# Patient Record
Sex: Female | Born: 1960
Health system: Southern US, Community
[De-identification: ages and names within clinical notes are randomized; demographics above are authoritative.]

## PROBLEM LIST (undated history)

## (undated) DIAGNOSIS — R112 Nausea with vomiting, unspecified: Secondary | ICD-10-CM

## (undated) DIAGNOSIS — I447 Left bundle-branch block, unspecified: Secondary | ICD-10-CM

## (undated) DIAGNOSIS — I428 Other cardiomyopathies: Secondary | ICD-10-CM

## (undated) DIAGNOSIS — G4700 Insomnia, unspecified: Secondary | ICD-10-CM

## (undated) DIAGNOSIS — I10 Essential (primary) hypertension: Secondary | ICD-10-CM

## (undated) DIAGNOSIS — Z9889 Other specified postprocedural states: Secondary | ICD-10-CM

## (undated) DIAGNOSIS — D649 Anemia, unspecified: Secondary | ICD-10-CM

## (undated) DIAGNOSIS — E7800 Pure hypercholesterolemia, unspecified: Secondary | ICD-10-CM

## (undated) DIAGNOSIS — I5022 Chronic systolic (congestive) heart failure: Secondary | ICD-10-CM

## (undated) DIAGNOSIS — E669 Obesity, unspecified: Secondary | ICD-10-CM

## (undated) HISTORY — PX: ABDOMINAL HYSTERECTOMY: SHX81

## (undated) HISTORY — DX: Left bundle-branch block, unspecified: I44.7

## (undated) HISTORY — DX: Pure hypercholesterolemia, unspecified: E78.00

## (undated) HISTORY — PX: WISDOM TOOTH EXTRACTION: SHX21

## (undated) HISTORY — PX: COLONOSCOPY: SHX174

## (undated) HISTORY — DX: Other cardiomyopathies: I42.8

## (undated) HISTORY — DX: Chronic systolic (congestive) heart failure: I50.22

## (undated) HISTORY — DX: Obesity, unspecified: E66.9

## (undated) HISTORY — PX: OTHER SURGICAL HISTORY: SHX169

---

## 2002-08-26 ENCOUNTER — Encounter (INDEPENDENT_AMBULATORY_CARE_PROVIDER_SITE_OTHER): Payer: Self-pay | Admitting: *Deleted

## 2002-08-26 ENCOUNTER — Observation Stay (HOSPITAL_COMMUNITY): Admission: RE | Admit: 2002-08-26 | Discharge: 2002-08-27 | Payer: Self-pay | Admitting: Obstetrics and Gynecology

## 2004-08-01 ENCOUNTER — Other Ambulatory Visit: Admission: RE | Admit: 2004-08-01 | Discharge: 2004-08-01 | Payer: Self-pay | Admitting: Obstetrics and Gynecology

## 2004-10-05 ENCOUNTER — Encounter: Admission: RE | Admit: 2004-10-05 | Discharge: 2004-10-05 | Payer: Self-pay | Admitting: General Surgery

## 2004-10-07 ENCOUNTER — Encounter (INDEPENDENT_AMBULATORY_CARE_PROVIDER_SITE_OTHER): Payer: Self-pay | Admitting: Specialist

## 2004-10-07 ENCOUNTER — Ambulatory Visit (HOSPITAL_BASED_OUTPATIENT_CLINIC_OR_DEPARTMENT_OTHER): Admission: RE | Admit: 2004-10-07 | Discharge: 2004-10-07 | Payer: Self-pay | Admitting: General Surgery

## 2004-10-07 ENCOUNTER — Ambulatory Visit (HOSPITAL_COMMUNITY): Admission: RE | Admit: 2004-10-07 | Discharge: 2004-10-07 | Payer: Self-pay | Admitting: General Surgery

## 2004-12-06 ENCOUNTER — Other Ambulatory Visit: Admission: RE | Admit: 2004-12-06 | Discharge: 2004-12-06 | Payer: Self-pay | Admitting: Obstetrics and Gynecology

## 2005-04-26 ENCOUNTER — Other Ambulatory Visit: Admission: RE | Admit: 2005-04-26 | Discharge: 2005-04-26 | Payer: Self-pay | Admitting: Obstetrics and Gynecology

## 2005-09-05 ENCOUNTER — Other Ambulatory Visit: Admission: RE | Admit: 2005-09-05 | Discharge: 2005-09-05 | Payer: Self-pay | Admitting: Obstetrics and Gynecology

## 2006-12-08 ENCOUNTER — Encounter (INDEPENDENT_AMBULATORY_CARE_PROVIDER_SITE_OTHER): Payer: Self-pay | Admitting: Obstetrics and Gynecology

## 2006-12-08 ENCOUNTER — Ambulatory Visit (HOSPITAL_COMMUNITY): Admission: RE | Admit: 2006-12-08 | Discharge: 2006-12-08 | Payer: Self-pay | Admitting: Obstetrics and Gynecology

## 2008-02-28 HISTORY — PX: CARDIAC CATHETERIZATION: SHX172

## 2008-06-30 ENCOUNTER — Encounter: Admission: RE | Admit: 2008-06-30 | Discharge: 2008-06-30 | Payer: Self-pay | Admitting: Cardiology

## 2008-07-07 ENCOUNTER — Ambulatory Visit (HOSPITAL_COMMUNITY): Admission: RE | Admit: 2008-07-07 | Discharge: 2008-07-07 | Payer: Self-pay | Admitting: Cardiology

## 2009-11-25 ENCOUNTER — Encounter: Admission: RE | Admit: 2009-11-25 | Discharge: 2009-11-25 | Payer: Self-pay | Admitting: Family Medicine

## 2009-11-25 IMAGING — US US EXTREM LOW VENOUS*L*
1 series · 14 of 24 positions shown · non-contrast
Comparison: None.

CLINICAL DATA: Left lower extremity pain and swelling.

LEFT LOWER EXTREMITY VENOUS DOPPLER ULTRASOUND [DATE]:
TECHNIQUE: Gray-scale sonography with compression, as well as
color and duplex Doppler ultrasound, were performed to evaluate the
deep venous system from the level of the common femoral vein
through the popliteal and proximal calf veins. Evaluation also
included physiologic response to augmentation.

[Series 1: us extrem low venous*left* · 14 of 29 slices shown]
[im 1/29]
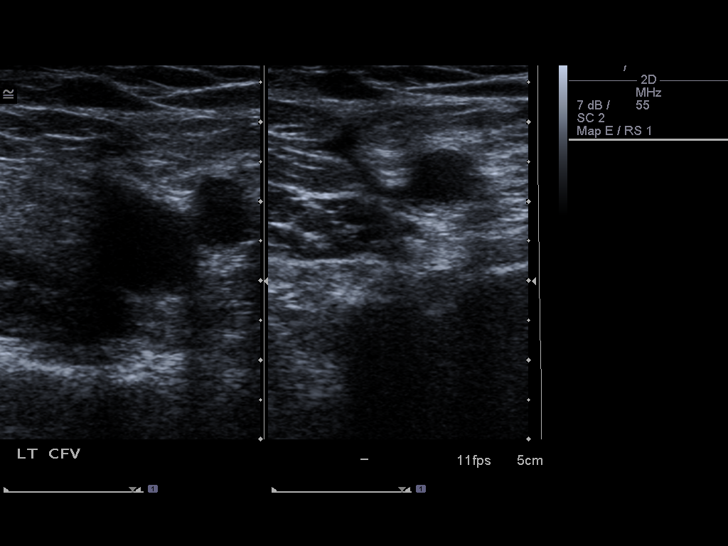
[im 3/29]
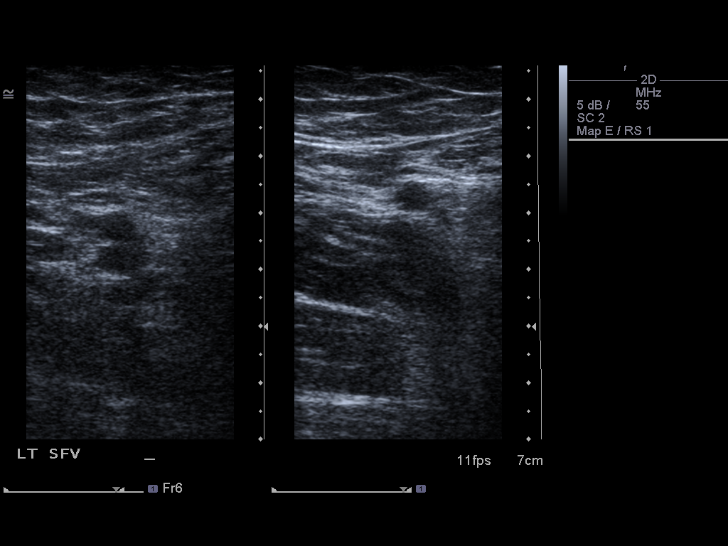
[im 5/29]
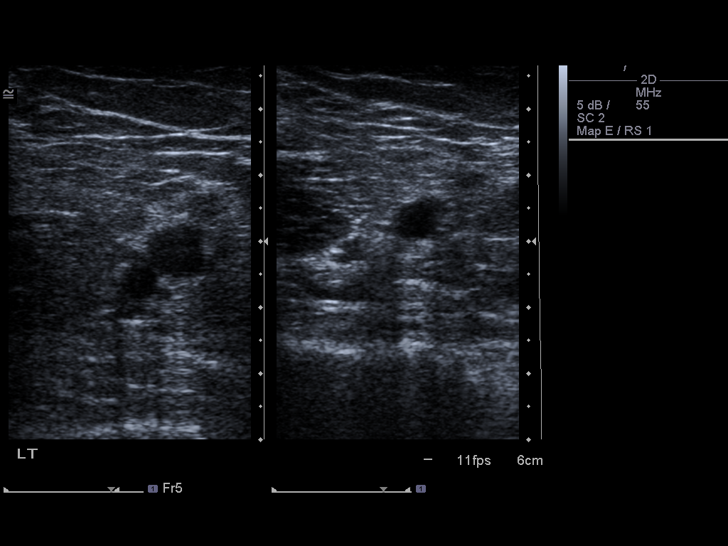
[im 8/29]
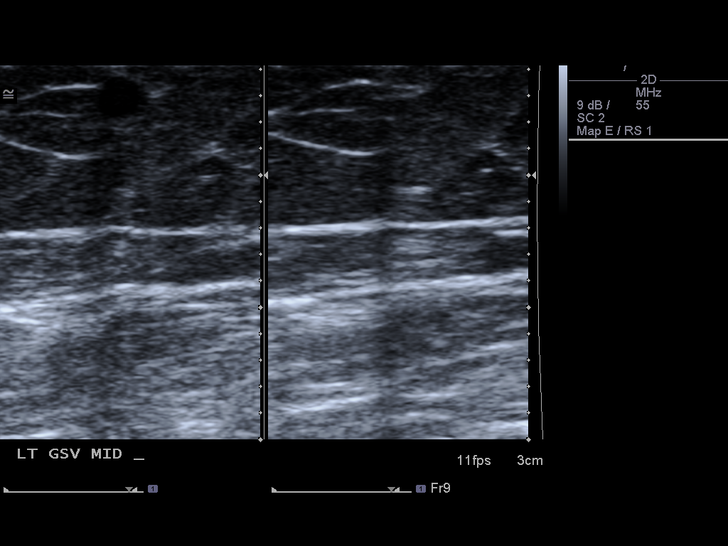
[im 9/29]
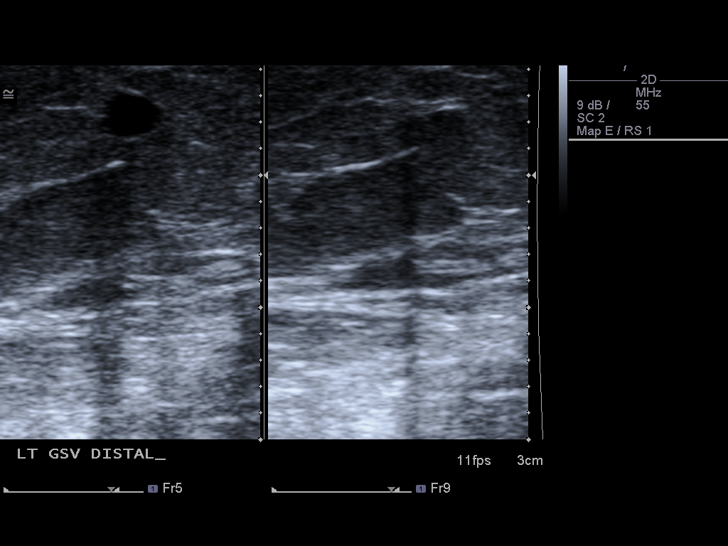
[im 11/29]
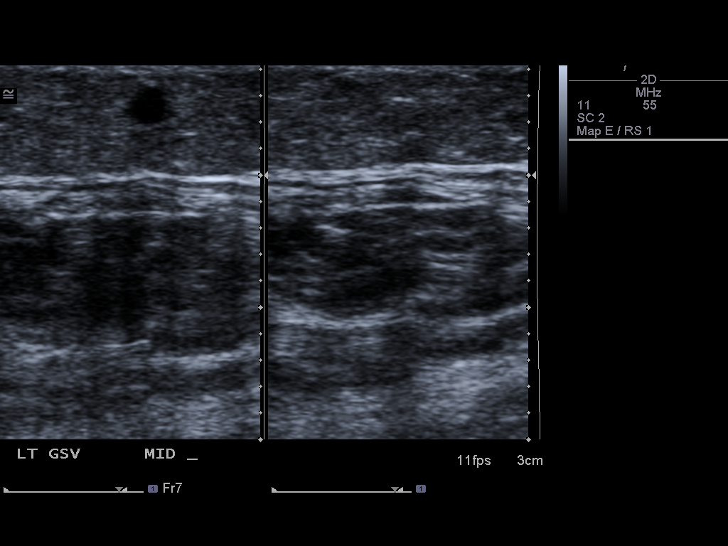
[im 14/29]
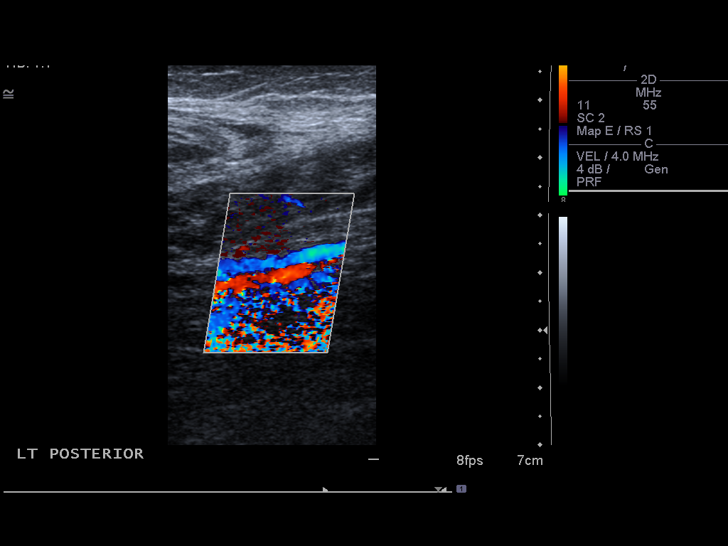
[im 15/29]
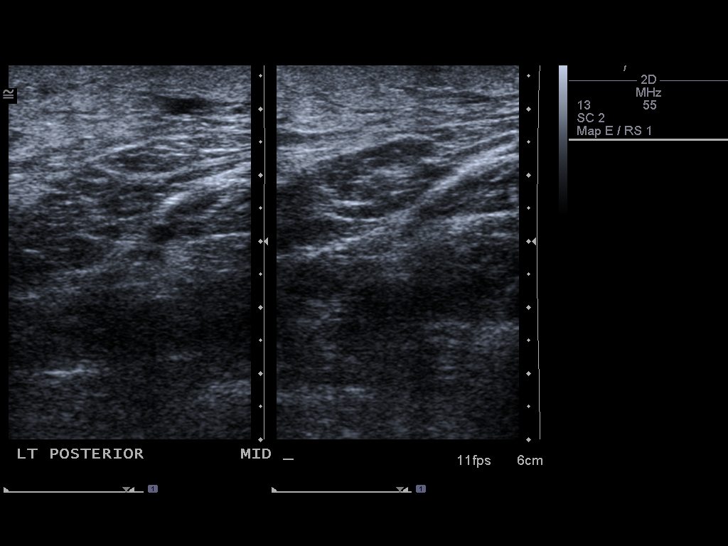
[im 18/29]
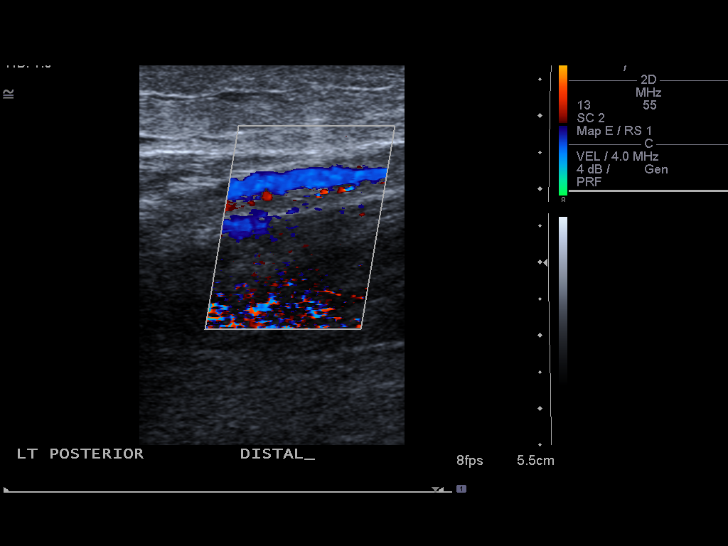
[im 20/29]
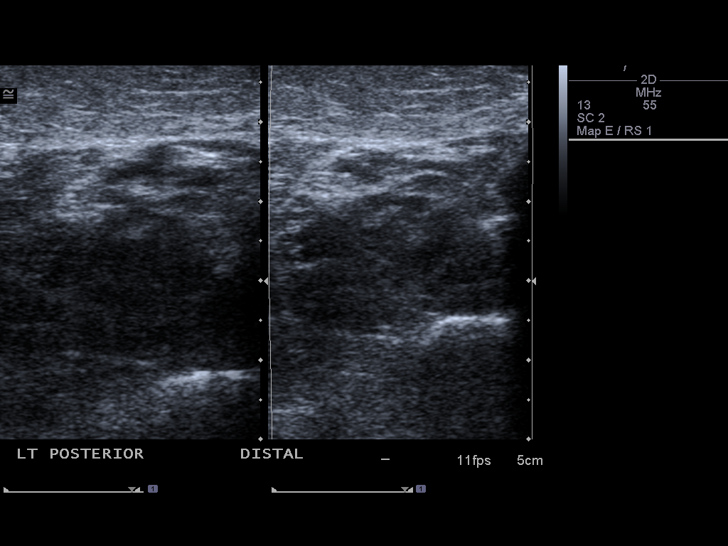
[im 22/29]
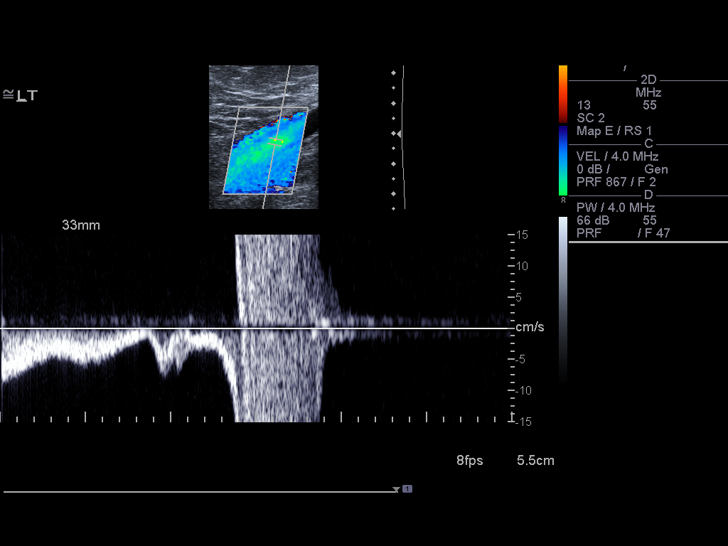
[im 24/29]
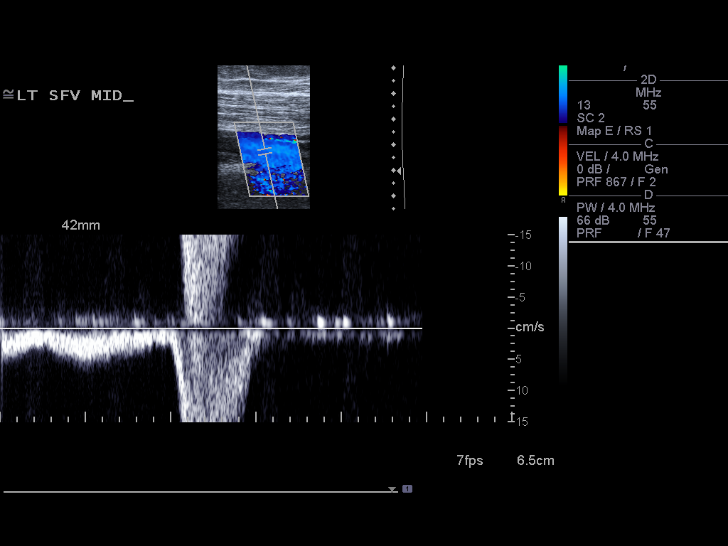
[im 26/29]
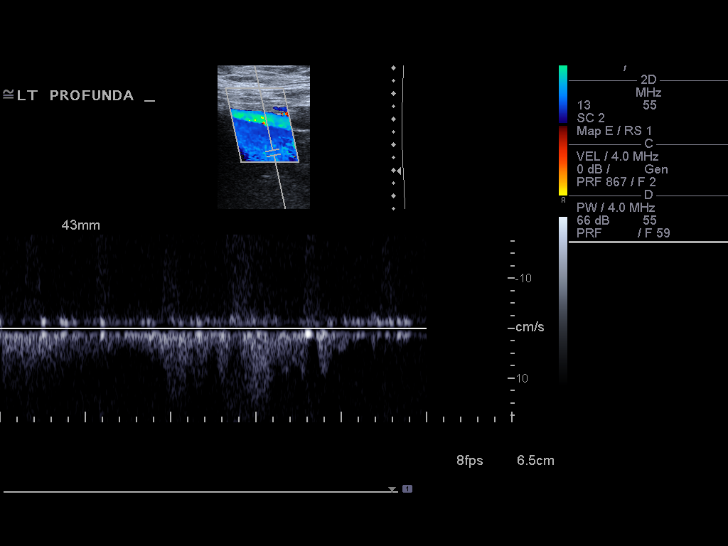
[im 29/29]
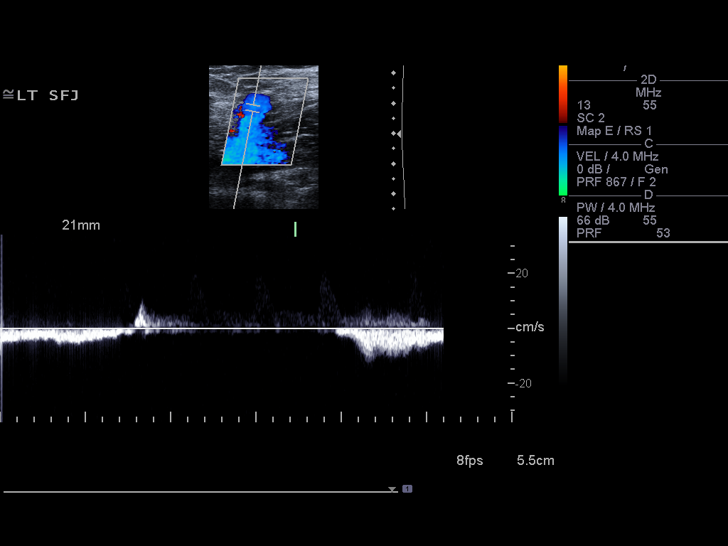

[14 of 24 positions shown; findings below may reference images not displayed]

FINDINGS: All of the visualized deep veins in the left lower
extremity demonstrate normal Doppler signal, normal
compressibility, normal phasicity, and normal physiologic response
to augmentation.  No gray scale filling defects were identified.
The visualized greater saphenous veins similarly patent in its
visualized course in the left lower extremity.
IMPRESSION: No evidence of left lower extremity DVT or superficial
thrombophlebitis.

## 2010-06-28 HISTORY — PX: FEMORAL ARTERY STENT: SHX1583

## 2010-07-12 NOTE — H&P (Signed)
Terri [name], Terri [name]               ACCOUNT NO.:  [id number]   MEDICAL RECORD NO.:  [id number]          PATIENT TYPE:  AMB   LOCATION:  SDC                           [facility]ACILITY:  [facility]   PHYSICIAN:  Hal Morales, M.D.DATE OF BIRTH:  June 10, 1960   DATE OF ADMISSION:  12/08/2006  DATE OF DISCHARGE:                              HISTORY & PHYSICAL   HISTORY OF PRESENT ILLNESS:  The patient is a 50 year old black female  who presents for management of a new left vulvar cystic lesion.  The  patient noted on November 22, 2006, a lump in the vaginal area which  was not painful except with sitting.  There was no induration or  discharge.  The patient was seen in our office on November 29, 2006, at  which time the cystic lesion could not be aspirated and a decision was  made for excision of the lesion.  The patient had had previous episodes  of insertional dyspareunia, but had never been noted to have cystic  lesions.   PAST MEDICAL HISTORY:  The patient has hypertension which is well  controlled with oral medications.   PAST SURGICAL HISTORY:  The patient had a laparoscopically-assisted  vaginal hysterectomy in 2004 for uterine fibroids, menorrhagia, and  anemia.  She underwent a transvaginal urethral suspension prior to 2004  for urinary stress incontinence.   FAMILY HISTORY:  Positive for heart disease, thyroid disease,  hypertension, diabetes, and migraine headaches.   CURRENT MEDICATIONS:  1. Estrogen vaginal cream.  2. Benicar.  3. Hydrochlorothiazide.   ALLERGIES:  No known drug allergies.   REVIEW OF SYSTEMS:  Specifically negative for vaginal discharge or  vaginal pain, but positive for insertional dyspareunia.   PHYSICAL EXAMINATION:  VITAL SIGNS:  Blood pressure 130/80, pulse 80.  LUNGS:  Clear.  HEART:  Regular rate and rhythm.  ABDOMEN:  Soft without masses or organomegaly.  PELVIC:  EGBUS shows a 2 cm mass in the left posterior vulva which is  submucosal.  Solid versus  tensely cystic, mobile, and nontender.  The  vaginal apex is well-healed.  There are no adnexal masses, no  rectovaginal masses.   IMPRESSION:  Recent onset of a vulvar cyst.   DISPOSITION:  The patient will undergo excision of the vulvar cyst as an  outpatient procedure.  The risks of anesthesia, bleeding, infection, and  damage to adjacent organs have been explained and the patient wishes to  proceed.  This will be done at [facility] on December 08, 2006.      Hal Morales, M.D.  Electronically Signed     VPH/MEDQ  D:  12/07/2006  T:  12/07/2006  Job:  [id number]

## 2010-07-12 NOTE — Op Note (Signed)
[name], Terri [name]               ACCOUNT NO.:  [id number]   MEDICAL RECORD NO.:  [id number]          PATIENT TYPE:  AMB   LOCATION:  SDC                           [facility]   PHYSICIAN:  Hal Morales, M.D.DATE OF BIRTH:  01/04/1961   DATE OF PROCEDURE:  12/08/2006  DATE OF DISCHARGE:                               OPERATIVE REPORT   PREOPERATIVE DIAGNOSIS:  Left vulvar cyst.   POSTOPERATIVE DIAGNOSIS:  Left vulvar cyst and left vulvar pigmented  lesion.   PROCEDURE:  Excision of left vulvar cyst, excisional biopsy of left  vulvar pigmented lesion.   SURGEON:  Hal Morales, M.D.   ANESTHESIA:  Monitored anesthesia care and local.   ESTIMATED BLOOD LOSS:  Less than 20 mL.   COMPLICATIONS:  None.   FINDINGS:  The left vulvar area contained a 3 cm mobile cystic mass just  beneath the vaginal mucosa at the introitus.  The left vulvar skin  contained a 1 cm hyperpigmented lesion.   OPERATIVE PROCEDURE:  The patient was taken to the operating room, after  appropriate identification, and placed on the operating table.  After  placement of equipment for monitored anesthesia care, she was placed in  the lithotomy position.  The perineum and vagina were prepped with  multiple layers of Betadine and draped as a sterile field.  The left  vulvar region was infiltrated first with 10 mL of 2% lidocaine and then  with 10 mL of 0.25% Marcaine.  The skin overlying the cystic vulvar  lesion was incised down to the level of the cyst and a combination of  blunt and sharp dissection used to dissect out the cystic lesion intact.  The aforementioned vulvar skin lesion was then circumscribed with a  scalpel and excised.  That incision was closed with subcuticular sutures  of 5-0 Vicryl.  The bed of the cystic lesion was then closed with  interrupted sutures of 3-0 Vicryl and the squamomucosal junction closed  with subcuticular sutures of 4-0 Vicryl.  Hemostasis was noted to be  adequate and the patient was taken from the operating room to the  recovery room in satisfactory condition, having tolerated the procedure  well with sponge and instrument counts correct.   DISCHARGE MEDICATIONS:  Ibuprofen 600 mg p.o. q.6h. for 24 hours and  then as needed for pain.   DISCHARGE INSTRUCTIONS:  Discharge instructions printed from the [facility].  Discharge instructions include ice packs to the perineum for  24 hours.  Follow up in two weeks with Dr. Pennie Rushing.      Hal Morales, M.D.  Electronically Signed     VPH/MEDQ  D:  12/08/2006  T:  12/08/2006  Job:  [id number]

## 2010-07-15 NOTE — Op Note (Signed)
Terri [name], APPLEMAN                         ACCOUNT NO.:  [id number]   MEDICAL RECORD NO.:  [id number]                   PATIENT TYPE:  OBV   LOCATION:  9325                                 [facility]ILITY[facility]   PHYSICIAN:  Hal Morales, M.D.             DATE OF BIRTH:  May 02, 1960   DATE OF PROCEDURE:  08/26/2002  DATE OF DISCHARGE:                                 OPERATIVE REPORT   PREOPERATIVE DIAGNOSES:  1. Menorrhagia.  2. Anemia.  3. Uterine fibroids.  4. Simple endometrial hyperplasia.   POSTOPERATIVE DIAGNOSES:  1. Menorrhagia.  2. Anemia.  3. Uterine fibroids.  4. Simple endometrial hyperplasia.   OPERATION:  Diagnostic laparoscopy, total vaginal hysterectomy with uterine  morcellization.   ANESTHESIA:  General orotracheal anesthesia.   ESTIMATED BLOOD LOSS:  250 mL.   COMPLICATIONS:  None.   SURGEON:  Hal Morales, M.D.   FIRST ASSISTANT:  Elmira J. Adline Peals.   FINDINGS:  The uterus was enlarged to approximately 12-week size with  multiple myomata.  The weight was 329.7 g.  The ovaries appeared normal. The  tubes were status post bilateral ligation for sterilization.   DESCRIPTION OF PROCEDURE:  The patient was taken to the operating room after  appropriate identification and placed on the operating table.  After the  attainment of adequate general anesthesia, she was placed in the modified  lithotomy position.  The abdomen, perineum, and vagina were prepped with  multiple layers of Betadine. A Foley catheter was inserted into the bladder  and connected to straight drainage.  A Hulka tenaculum was placed on the  cervix.  The abdomen was draped as a sterile field.  Subumbilical and  suprapubic injection of a total of 10 mL of 0.25% Marcaine was undertaken.  A subumbilical incision was made and a Veress cannula placed through that  incision into the peritoneal cavity.  A pneumoperitoneum was created with  3.5 liters of CO2.  The laparoscopic  trocar was placed through that incision  after removal of the Veress cannula and the laparoscope placed through the  trocar sleeve.  A suprapubic incision was made to the left of midline and a  laparoscopic probe trocar placed through that incision into the peritoneal  cavity under direct visualization.  The above noted findings were made and  the decision made that vaginal hysterectomy was feasible.  The vagina then  became the site of surgery.  A weighted speculum was placed in the vagina  and the Hulka tenaculum replaced with two Lahey tenaculums on the cervix.  The cervicovaginal mucosa was injected with a dilute solution of Pitressin  and then circumscribed.  The anterior vaginal mucosa was dissected off the  anterior cervix to allow entry into the anterior peritoneum.  The posterior  peritoneum was sharply entered and the right and left uterosacral ligaments  clamped, cut, and suture ligated with those sutures being held.  Paracervical tissues, parametrial tissues were then clamped, cut, and suture  ligated.  The uterus was then morcellated to allow into the involuted and  the upper pedicles clamped, cut and tied with free tie and suture ligated  with those sutures being held.  The uterus was removed from the operative  field in portions during the morcellization.  Hemostasis was noted to be  adequate.  A McCall culdoplasty suture was placed in the posterior cul-de-  sac incorporating the uterosacral ligaments and the intervening peritoneum.  The sutures were checked then held for the uterosacral ligaments, were then  used to create the vaginal angle on either side.  The remainder of the  vaginal cuff was then closed in a single layer incorporating anterior  vaginal mucosa, anterior peritoneum, posterior peritoneum and posterior  vaginal mucosa in figure-of-eight sutures.  All sutures used in this  procedure were 0 Vicryl.  Hemostasis was noted to be adequate and the vagina  was  packed with two inch plain gauze moistened with Estrace cream.  The  trocars were removed from the abdomen and a single suture of 0 Vicryl placed  in the fascia of the subumbilical incision.  Subcuticular suture of 3-0  Vicryl was then used to close that incision.  A subcuticular suture of 3-0  Vicryl was likewise used to close the suprapubic incision.  These two  incisions were covered with sterile dressings and the patient awakened from  general anesthesia, then taken to the recovery room in satisfactory  condition having tolerated the procedure well.  The sponge and instrument  counts correct.   SPECIMENS:  The uterus and cervix were sent to the pathology with the uterus  having been morcellated.                                               Hal Morales, M.D.    VPH/MEDQ  D:  08/26/2002  T:  08/26/2002  Job:  [id number]

## 2010-07-15 NOTE — H&P (Signed)
   [name], [name]                         ACCOUNT NO.:  [id number]   MEDICAL RECORD NO.:  [id number]                   PATIENT TYPE:  AMB   LOCATION:  SDC                                  [facility]   PHYSICIAN:  Hal Morales, M.D.             DATE OF BIRTH:  Jun 23, 1960   DATE OF ADMISSION:  DATE OF DISCHARGE:                                HISTORY & PHYSICAL   ADDENDUM TO DICTATION #[id number]   CURRENT MEDICATIONS:  1. Provera 40 mg daily.  2. Ferrous sulfate one tablet twice daily.  3. Avalide 150/12.5 one-half tablet daily.   ALLERGIES:  The patient has no known drug allergies.     Terri [name].                    Hal Morales, M.D.    EJP/MEDQ  D:  08/15/2002  T:  08/15/2002  Job:  [id number]

## 2010-07-15 NOTE — Op Note (Signed)
NAMELAVONE, Terri [name]               ACCOUNT NO.:  [id number]   MEDICAL RECORD NO.:  [id number]          PATIENT TYPE:  AMB   LOCATION:  NESC                         FACILITY:  [facility]   PHYSICIAN:  Leonie Man, M.D.   DATE OF BIRTH:  06/24/1960   DATE OF PROCEDURE:  10/07/2004  DATE OF DISCHARGE:                                 OPERATIVE REPORT   PREOPERATIVE DIAGNOSIS:  Lipoma off the left posterior axillary region.   POSTOPERATIVE DIAGNOSIS:  Lipoma off the left posterior axillary region.   PROCEDURE:  Excision lipoma left posterior axillary region.   SURGEON:  Leonie Man, M.D.   ASSISTANT:  OR tech.   ANESTHESIA:  General.   Note, the patient is a 50 year old woman with an enlarging mass in the  posterior axillary line extending toward the trapezius muscles in the back.  This has been causing her significant discomfort and ill-fitting clothing.  She comes to the operating room now for removal of this mass after the risks  and potential benefits of surgery have been discussed, all questions  answered and consent obtained.   DESCRIPTION OF PROCEDURE:  Following the induction of satisfactory general  anesthesia, the patient positioned supinely and then turned into the right  lateral recumbent position with her left side up. This region was prepped  and draped to be included in a sterile operative field. I infiltrated around  the lesion with 0.5% Marcaine with epinephrine. A transverse incision made  in the skin lines deep and through the skin and subcutaneous tissue to the  capsule of the mass. The mass was then dissected free in all areas and  removed and forwarded for pathologic evaluation. Hemostasis assured with  electrocautery. Sponge and instrument counts verified and subcutaneous  tissues closed with interrupted 3-0 Vicryl sutures. Skin closed with running  4-0 Monocryl suture and then reinforced with Steri-Strips. A sterile  dressing is applied. The anesthetic  reversed. The patient removed from the  operating room to the recovery room in stable condition. She tolerated the  procedure well.       PB/MEDQ  D:  10/07/2004  T:  10/07/2004  Job:  [id number]

## 2010-07-15 NOTE — H&P (Signed)
NAME:  Terri [name], Terri [name]                         ACCOUNT NO.:  [id number]   MEDICAL RECORD NO.:  [id number]                   [name] TYPE:  AMB   LOCATION:  SDC                                  [facility]   PHYSICIAN:  Hal Morales, M.D.             DATE OF BIRTH:  [birth year]   DATE OF ADMISSION:  08/15/2002  DATE OF DISCHARGE:                                HISTORY & PHYSICAL   HISTORY OF PRESENT ILLNESS:  Terri [name] is a 50 year old widowed African-  American female (para 2-0-1-1), who presents for laparoscopy and total  vaginal hysterectomy because of menorrhagia and fibroids unresponsive to  hormonal therapy.  Up until one year ago Terri [name] had regular monthly  menstrual periods; however, over Terri past year Terri [name] menses have become  irregular and very heavy.  Terri [name] reports that Terri [name] has skipped periods  within Terri past year, followed by heavy menstrual bleeding which lasts as  much as 14 days.  More recently, since May 03, 2002, Terri [name] has bled  continuously in spite of Provera therapy, (30 mg to 40 mg daily) until August 10, 2002, when it finally stopped.  During this period Terri [name] wore an  overnight pad with disposable baby diapers, changing approximately five  times daily on most days, and other days use overnight pads changing twice  daily.  This bleeding was accompanied by menstrual cramps, which Terri [name] rates  as a 9/10 on a 10-point scale; however, Terri [name] received relief to 3/10 on a 10-  point scale with ibuprofen 800 mg.  Terri [name] also mentioned that this pain would  radiate to both hips and Terri [name] lower back area.  In Terri course of Terri [name]  symptoms, Terri [name] had a hemoglobin as low as 7.3 in February 2004;  however, Terri [name] reticulocyte count was normal, Terri [name] had a normal hemoglobin  electrophoresis, negative gonorrhea and chlamydia cultures, and normal CSH.  A pelvic ultrasound in February 2004 revealed multiple uterine fibroids,  with Terri largest measuring 4.0 x  3.9 x 4.3 cm (some fibroids displaced Terri  endometrial canal) and a simple right ovarian cyst measuring 2.6 x 2.7 x 2.8  cm.  An endometrial biopsy also in February 2004 revealed simple hyperplasia  without atypia.   As a consideration of medical and surgical options presented to Terri [name], Terri  [name] has agreed to undergo hysterectomy for definitive therapy of Terri [name]  symptoms.   PAST MEDICAL HISTORY:   OBSTETRICAL HISTORY:  Gravida 3, para 2-0-1-1; Terri [name] has had a  cesarean section in Terri past.   GYNECOLOGICAL HISTORY:  Menarche at 50 years old.  Terri [name]'s menstrual  periods were regular up until one year ago.  See history of present illness.  Terri [name] uses bilateral tubal ligation as Terri [name] method of contraception.  Terri [name] denies any history of sexually transmitted diseases or abnormal Pap  smears.  Terri [name]'s last normal  Pap smear was June 2003.  Terri [name] last normal  mammogram was April 2004.   MEDICAL HISTORY:  Positive for anemia and hypertension.   SURGICAL HISTORY:  Terri [name] had bladder suspension in Terri 1990's.  Terri [name]  denies any problems with anesthesia.  Terri [name] has never had a blood transfusion;  however, Terri [name] did receive platelets with Terri [name] last delivery approximately 11  years ago.   FAMILY HISTORY:  Positive for heart disease, hypertension, insulin-dependent  diabetes mellitus and migraines.   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   HABITS:  Terri [name] does not use tobacco or alcohol.   REVIEW OF SYSTEMS:  Terri [name] occasionally has sinusitis.  Terri [name] does wear  reading glasses.  Terri [name] admits to frequent flatulence.  Otherwise, except as  mentioned in Terri history of present illness, Terri [name] review of systems are  negative.   PHYSICAL EXAMINATION:  VITAL SIGNS:  Blood pressure 110/70, weight 174  pounds, height 5 feet 1-1/2 inches.  NECK:  Supple without  masses.  There is no adenopathy or thyromegaly.  HEART:  Regular rate and rhythm.  There is no murmur.  LUNGS:  Clear to auscultation.  There are no wheezes, rales or rhonchi.  BACK:  No CVA tenderness.  ABDOMEN:  Bowel sounds are present.  Soft.  Terri [name] does have tenderness  in both lower quadrants; however, there is no guarding, rebound or  organomegaly.  EXTREMITIES:  Without clubbing, cyanosis or edema.  PELVIC:  EGBUS is within normal limits.  Vagina is rugose.  Cervix has  Nabothian cyst, otherwise nontender.  Uterus appears approximately 10-week  size, is retroverted without tenderness.  Adnexa without tenderness or  masses.  Rectovaginal examination confirms.   IMPRESSION:  1. Menorrhagia.  2. Anemia (hemoglobin as low as 7.3).  3. Fibroid uterus.  4. Simple endometrial hyperplasia.   DISPOSITION:  Management options for fibroids and menorrhagia were reviewed  with Terri [name] by Dr. Pennie Rushing, along with Terri risks and benefits of each.  Terri [name] has chosen definitive treatment for Terri [name] symptoms in Terri form of  hysterectomy, and understands Terri implications for this procedure along with  Terri risks, which include, but are limited to:  reaction to anesthesia,  damage to adjacent organs, bleeding, infection.  Terri [name] further understands  that Terri [name] will lose Terri [name] reproductive ability with this procedure.   Terri [name] has consented to undergo a laparoscopically-assisted vaginal  hysterectomy, with Terri possibility of a total abdominal hysterectomy on August 26, 2002 at 7:30 a.m. at [facility] [employer].     Elmira J. Adline Peals.                    Hal Morales, M.D.    EJP/MEDQ  D:  08/15/2002  T:  08/15/2002  Job:  [id number]

## 2010-07-15 NOTE — Discharge Summary (Signed)
NAMEHAJIRA, VERHAGEN                         ACCOUNT NO.:  [id number]   MEDICAL RECORD NO.:  [id number]                   PATIENT TYPE:  OBV   LOCATION:  9325                                 [facility]ILITY[facility]   PHYSICIAN:  Hal Morales, M.D.             DATE OF BIRTH:  10-16-1960   DATE OF ADMISSION:  08/26/2002  DATE OF DISCHARGE:  08/27/2002                                 DISCHARGE SUMMARY   DISCHARGE DIAGNOSES:  1. Menorrhagia.  2. Anemia (hemoglobin as low as 7.3).  3. Fibroid uterus.  4. Simple endometrial hyperplasia.   OPERATION:  On the date of admission patient underwent laparoscopy with a  total vaginal hysterectomy and uterine morcellation.  Patient was found to  have a multiple fibroid uterus weighing 329.7 grams, along with normal  appearing ovaries and tubes which had been previously ligated bilaterally.   HISTORY OF PRESENT ILLNESS:  Ms. Terri [name] is a 50 year old widowed, African  American female para 2-0-1-1 who presents for laparoscopy and total vaginal  hysterectomy because of menorrhagia and uterine fibroids unresponsive to  hormonal therapy.  Please see patient's dictated history and physical  examination for details.   PHYSICAL EXAMINATION:  VITAL SIGNS:  Blood pressure 110/70, weight 174  pounds, height 5 feet 1 and 1/2 inches tall.  GENERAL:  Within normal limits.  ABDOMEN:  Note on pre-operative abdominal exam patient had tenderness in  both lower quadrants but there was no guarding, rebound or organomegaly.  PELVIC:  EG, BUS within normal limits.  Vagina is rugous.  Cervix had  nabothian cyst otherwise not tender without other lesions.  Uterus appeared  approximately ten weeks size, was retroverted and without tenderness.  Her  adnexa without tenderness or masses.  Rectovaginal exam confirmed.   HOSPITAL COURSE:  On the date of admission the patient underwent  aforementioned procedures, tolerating them all well.  Postoperative course  was marked by  nausea which resolved with Reglan 10 mg every six hours.  Patient was afebrile throughout her hospital course and post-op hemoglobin  was that of 8.7 (pre-op hemoglobin 10.4).  By post-op day one, by the  afternoon of post-op day #1 the patient had resumed bowel and bladder  function and was, therefore, deemed ready for discharge home.   DISCHARGE MEDICATIONS:  1. Tylox 1-2 tablets every four to six hours as needed for pain.  2. Iron one tablet twice daily for six weeks.  3. Ibuprofen 600 mg one tablet every six hours with food for five days then     as needed for pain.  4. Colace 100 mg twice daily until bowel movements are regular.  5. Avalide 150/12.5 mg one half tablet daily for blood pressure.  6. Reglan 10 mg every six hours as needed for nausea.   FOLLOW UP:  Patient is scheduled for her six weeks postoperative visit with  Dr. Pennie Rushing on October 07, 2002 at 1:30  p.m.   DISCHARGE INSTRUCTIONS:  Patient was given a copy of [facility]  postoperative instruction sheet.  She was further advised to avoid driving  for two weeks, heavy lifting for four weeks and intercourse for six weeks.  The patient's diet was without restriction.  Her final pathology was not  available at the time of the patient's discharge.      Elmira J. Adline Peals.                    Hal Morales, M.D.    EJP/MEDQ  D:  08/27/2002  T:  08/27/2002  Job:  [id number]

## 2011-04-21 ENCOUNTER — Other Ambulatory Visit (HOSPITAL_COMMUNITY): Payer: Self-pay | Admitting: Cardiology

## 2011-04-21 DIAGNOSIS — I4891 Unspecified atrial fibrillation: Secondary | ICD-10-CM

## 2011-05-05 ENCOUNTER — Ambulatory Visit (HOSPITAL_COMMUNITY)
Admission: RE | Admit: 2011-05-05 | Discharge: 2011-05-05 | Disposition: A | Discharge status: Home / Self Care | Payer: 59 | Source: Ambulatory Visit | Attending: Cardiology | Admitting: Cardiology

## 2011-05-05 DIAGNOSIS — R0989 Other specified symptoms and signs involving the circulatory and respiratory systems: Secondary | ICD-10-CM | POA: Insufficient documentation

## 2011-05-05 DIAGNOSIS — R0609 Other forms of dyspnea: Secondary | ICD-10-CM | POA: Insufficient documentation

## 2012-01-19 ENCOUNTER — Emergency Department (HOSPITAL_COMMUNITY)
Admission: EM | Admit: 2012-01-19 | Discharge: 2012-01-19 | Disposition: A | Discharge status: Home / Self Care | Payer: 59 | Attending: Emergency Medicine | Admitting: Emergency Medicine

## 2012-01-19 ENCOUNTER — Emergency Department (HOSPITAL_COMMUNITY): Payer: 59

## 2012-01-19 ENCOUNTER — Encounter (HOSPITAL_COMMUNITY): Payer: Self-pay | Admitting: Emergency Medicine

## 2012-01-19 DIAGNOSIS — M79609 Pain in unspecified limb: Secondary | ICD-10-CM | POA: Insufficient documentation

## 2012-01-19 DIAGNOSIS — M79603 Pain in arm, unspecified: Secondary | ICD-10-CM

## 2012-01-19 DIAGNOSIS — R0781 Pleurodynia: Secondary | ICD-10-CM

## 2012-01-19 DIAGNOSIS — Z79899 Other long term (current) drug therapy: Secondary | ICD-10-CM | POA: Insufficient documentation

## 2012-01-19 DIAGNOSIS — R0789 Other chest pain: Secondary | ICD-10-CM | POA: Insufficient documentation

## 2012-01-19 DIAGNOSIS — I10 Essential (primary) hypertension: Secondary | ICD-10-CM | POA: Insufficient documentation

## 2012-01-19 DIAGNOSIS — R071 Chest pain on breathing: Secondary | ICD-10-CM | POA: Insufficient documentation

## 2012-01-19 HISTORY — DX: Essential (primary) hypertension: I10

## 2012-01-19 LAB — CBC
MCH: 26.6 pg (ref 26.0–34.0)
MCHC: 33.3 g/dL (ref 30.0–36.0)
RBC: 4.7 MIL/uL (ref 3.87–5.11)
WBC: 5 10*3/uL (ref 4.0–10.5)

## 2012-01-19 LAB — BASIC METABOLIC PANEL
CO2: 27 mEq/L (ref 19–32)
GFR calc Af Amer: >90 mL/min (ref >90)
GFR calc non Af Amer: >90 mL/min (ref >90)
Sodium: 138 mEq/L (ref 135–145)

## 2012-01-19 LAB — POCT I-STAT TROPONIN I

## 2012-01-19 MED ORDER — OXYCODONE-ACETAMINOPHEN 5-325 MG PO TABS
2.0000 | ORAL_TABLET | Freq: Once | ORAL | Status: AC
  Administered 2012-01-19: 2 via ORAL
  Filled 2012-01-19: qty 2

## 2012-01-19 NOTE — ED Provider Notes (Signed)
Medical screening examination/treatment/procedure(s) were conducted as a shared visit with non-physician practitioner(s) and myself.  I personally evaluated the patient during the encounter  I examined patient, sent by PCP for eval and possible admission.  Story not c/w ACS on my reval.  Her EKG appears unchanged (old LBBB).  Per PCP records she had negative cath in 2010 and nonischemic CM.  ddimer negative for PE.  CXR negative.  She has left arm pain worse with movement but no focal weakness and good/equal pulses.  She reports heavy lifting at work that may have worsened arm pain.  Doubt dissection or pericarditis/myocarditis   BP 151/81  Pulse 66  Temp 98.5 F (36.9 C) (Oral)  Resp 16  SpO2 100%   Joya Gaskins, MD 01/19/12 1652

## 2012-01-19 NOTE — ED Notes (Signed)
EDP in room at this time 

## 2012-01-19 NOTE — ED Notes (Addendum)
To ED via [facility] medic 32- from [place] physicians- had chest pain that started yesterday while working 8/10 pain. Worsened throughout night, pain on palpation and left arm movement, no numbness tingling, "feels funny" no nausea today-- had some yesterday.   Received NTG 1 sl, ASA 324mg  per EMS -- pain at present 6/10.

## 2012-01-19 NOTE — ED Provider Notes (Signed)
History     CSN: [id number]  Arrival date & time 01/19/12  1328   First MD Initiated Contact with Patient 01/19/12 1357      Chief Complaint  Patient presents with  . Chest Pain    (Consider location/radiation/quality/duration/timing/severity/associated sxs/prior treatment) HPI Comments: 51 year old female presents the emergency department via EMS from [facility] physician office complaining of sudden onset left sided chest pain beginning yesterday while at work. Patient works as a Sales executive. She describes the pain as a constant dull feeling with occasional sharp shooting pains rated 8/10. Admits to associated left arm pain throughout her entire arm. She received one nitroglycerin and aspirin from EMS without much relief. Currently her pain is 6/10 and not as bad as yesterday. Pain worse when she takes in a deep breath, coughs or laughs. Denies nausea, vomiting or diaphoresis. No shortness of breath. Patient sees Dr. Mayford Knife every 6 months for her hypertension and left bundle branch block. Nonsmoker and does not take any exogenous estrogen. Patient recently drove to [place] at the end of October and prior to that drove to [place].  Patient is a 51 y.o. female presenting with chest pain. The history is provided by the patient.  Chest Pain Pertinent negatives for primary symptoms include no shortness of breath, no nausea, no vomiting and no dizziness.  Pertinent negatives for associated symptoms include no diaphoresis, no numbness and no weakness.     Past Medical History  Diagnosis Date  . Hypertension     Past Surgical History  Procedure Date  . Cardiac catheterization     No family history on file.  History  Substance Use Topics  . Smoking status: Not on file  . Smokeless tobacco: Not on file  . Alcohol Use:     OB History    Grav Para Term Preterm Abortions TAB SAB Ect Mult Living                  Review of Systems  Constitutional: Negative for diaphoresis.    HENT: Negative for neck pain and neck stiffness.   Eyes: Negative for visual disturbance.  Respiratory: Negative for shortness of breath.   Cardiovascular: Positive for chest pain.  Gastrointestinal: Negative for nausea and vomiting.  Genitourinary: Negative.   Musculoskeletal: Positive for arthralgias. Negative for back pain.  Skin: Negative for rash.  Neurological: Negative for dizziness, weakness, light-headedness, numbness and headaches.  Psychiatric/Behavioral: Negative for confusion. The patient is not nervous/anxious.     Allergies  Review of patient's allergies indicates no known allergies.  Home Medications   Current Outpatient Rx  Name  Route  Sig  Dispense  Refill  . CARVEDILOL 25 MG PO TABS   Oral   Take 25 mg by mouth 2 (two) times daily with a meal.         . FUROSEMIDE 20 MG PO TABS   Oral   Take 20 mg by mouth 2 (two) times daily.         Marland Kitchen RAMIPRIL 10 MG PO CAPS   Oral   Take 10 mg by mouth daily.           BP 153/78  Pulse 67  Temp 98.5 F (36.9 C) (Oral)  Resp 16  SpO2 100%  Physical Exam  Nursing note and vitals reviewed. Constitutional: She is oriented to person, place, and time. She appears well-developed and well-nourished. No distress.  HENT:  Head: Normocephalic and atraumatic.  Mouth/Throat: Oropharynx is clear and moist.  Eyes: Conjunctivae normal and EOM are normal. Pupils are equal, round, and reactive to light.  Neck: Normal range of motion. Neck supple.  Cardiovascular: Normal rate, regular rhythm, normal heart sounds and intact distal pulses.   Pulmonary/Chest: Effort normal and breath sounds normal. No accessory muscle usage. No respiratory distress. She has no decreased breath sounds. She has no rhonchi. She has no rales. She exhibits no tenderness.       Deep inspiration re-creates chest pain.  Abdominal: Soft. Bowel sounds are normal. There is no tenderness.  Musculoskeletal: Normal range of motion. She exhibits no edema.        Left shoulder: She exhibits tenderness. She exhibits normal range of motion (pain with ROM) and no bony tenderness.  Neurological: She is alert and oriented to person, place, and time.  Skin: Skin is warm and dry. No rash noted.  Psychiatric: She has a normal mood and affect. Her behavior is normal.    ED Course  Procedures (including critical care time)   Labs Reviewed  CBC  BASIC METABOLIC PANEL  POCT I-STAT TROPONIN I  D-DIMER, QUANTITATIVE   Date: 01/19/2012  Rate: 63  Rhythm: normal sinus rhythm  QRS Axis: left  Intervals: normal  ST/T Wave abnormalities: normal  Conduction Disutrbances:left bundle branch block  Narrative Interpretation: sinus rhythm, LBBB, no stemi  Old EKG Reviewed: unchanged   Dg Chest Port 1 View  01/19/2012  *RADIOLOGY REPORT*  Clinical Data: Left chest/back pain  PORTABLE CHEST - 1 VIEW  Comparison: 06/30/2008  Findings: Lungs are clear. No pleural effusion or pneumothorax.  The heart is top normal in size.  IMPRESSION: No evidence of acute cardiopulmonary disease.   Original Report Authenticated By: Charline Bills, M.D.      1. Pleuritic chest pain   2. Arm pain       MDM  51 y/o female with pleuritic chest pain. Age >50, so she cannot PERC negative. Vitals unremarkable. CXR unremarkable. EKG without any changes. Troponin negative. D-dimer obtained due to the fact she has had two long car rides recently. She is in NAD. 3:12 PM D-dimer negative. Patient was sent from PCP for admission to rule out cardiac origin. Arm pain is musculoskeletal. Chest pain pleuritic. Suspicion of cardiac origin very low. Case discussed with Dr. Bebe Shaggy who also evaluated patient. We both do not feel admission is necessary. Patient agrees with this. Advised her to stay home from work tomorrow to rest. Advised ibuprofen for arm pain. Close return precautions discussed.        Trevor Mace, PA-C 01/19/12 1551

## 2012-12-17 ENCOUNTER — Telehealth: Payer: Self-pay | Admitting: Cardiology

## 2012-12-17 ENCOUNTER — Other Ambulatory Visit: Payer: Self-pay | Admitting: General Surgery

## 2012-12-17 MED ORDER — RAMIPRIL 10 MG PO CAPS: 10.0000 mg | ORAL_CAPSULE | Freq: Every day | ORAL | Status: DC

## 2012-12-17 MED ORDER — AMLODIPINE BESYLATE 5 MG PO TABS: 5.0000 mg | ORAL_TABLET | Freq: Every day | ORAL | Status: DC

## 2012-12-17 MED ORDER — FUROSEMIDE 20 MG PO TABS: 20.0000 mg | ORAL_TABLET | Freq: Two times a day (BID) | ORAL | Status: DC

## 2012-12-17 MED ORDER — CARVEDILOL 25 MG PO TABS: 25.0000 mg | ORAL_TABLET | Freq: Two times a day (BID) | ORAL | Status: DC

## 2012-12-17 NOTE — Telephone Encounter (Signed)
New message    Refill on all medications----[facility] in [facility]

## 2012-12-17 NOTE — Telephone Encounter (Signed)
Rx sent in for pt.

## 2012-12-19 ENCOUNTER — Encounter: Payer: Self-pay | Admitting: Cardiology

## 2012-12-26 DIAGNOSIS — R002 Palpitations: Secondary | ICD-10-CM

## 2013-05-21 ENCOUNTER — Encounter: Payer: Self-pay | Admitting: General Surgery

## 2013-05-21 DIAGNOSIS — I10 Essential (primary) hypertension: Secondary | ICD-10-CM

## 2013-05-21 DIAGNOSIS — I447 Left bundle-branch block, unspecified: Secondary | ICD-10-CM

## 2013-05-21 DIAGNOSIS — I428 Other cardiomyopathies: Secondary | ICD-10-CM

## 2013-05-21 DIAGNOSIS — I5022 Chronic systolic (congestive) heart failure: Secondary | ICD-10-CM

## 2013-05-21 DIAGNOSIS — R002 Palpitations: Secondary | ICD-10-CM

## 2013-05-23 ENCOUNTER — Ambulatory Visit: Payer: 59 | Admitting: Cardiology

## 2013-05-26 ENCOUNTER — Encounter: Payer: Self-pay | Admitting: Cardiology

## 2013-05-26 ENCOUNTER — Encounter: Payer: Self-pay | Admitting: General Surgery

## 2013-05-26 ENCOUNTER — Ambulatory Visit (INDEPENDENT_AMBULATORY_CARE_PROVIDER_SITE_OTHER): Payer: 59 | Admitting: Cardiology

## 2013-05-26 VITALS — BP 120/80 | HR 74 | Ht 62.0 in | Wt 171.0 lb

## 2013-05-26 DIAGNOSIS — I509 Heart failure, unspecified: Secondary | ICD-10-CM

## 2013-05-26 DIAGNOSIS — I420 Dilated cardiomyopathy: Secondary | ICD-10-CM

## 2013-05-26 DIAGNOSIS — I10 Essential (primary) hypertension: Secondary | ICD-10-CM

## 2013-05-26 DIAGNOSIS — I447 Left bundle-branch block, unspecified: Secondary | ICD-10-CM

## 2013-05-26 DIAGNOSIS — I5022 Chronic systolic (congestive) heart failure: Secondary | ICD-10-CM

## 2013-05-26 DIAGNOSIS — R079 Chest pain, unspecified: Secondary | ICD-10-CM | POA: Insufficient documentation

## 2013-05-26 DIAGNOSIS — I428 Other cardiomyopathies: Secondary | ICD-10-CM

## 2013-05-26 LAB — BASIC METABOLIC PANEL
BUN: 12 mg/dL (ref 6–23)
CHLORIDE: 104 meq/L (ref 96–112)
CO2: 25 meq/L (ref 19–32)
CREATININE: 0.9 mg/dL (ref 0.4–1.2)
Calcium: 9 mg/dL (ref 8.4–10.5)
GFR: 89.96 mL/min (ref >60.00)
Glucose, Bld: 97 mg/dL (ref 70–99)
Potassium: 3.8 mEq/L (ref 3.5–5.1)
Sodium: 136 mEq/L (ref 135–145)

## 2013-05-26 NOTE — Progress Notes (Signed)
  [place], [place], [place]  [address] Phone: [phone] Fax:  [phone]  Date:  05/26/2013   ID:  Terri [name], DOB 1961/01/28, MRN [id number]  PCP:  Vena Austria, MD  Cardiologist:  Fransico Him, MD    History of Present Illness: Terri [name] is a 53 y.o. female with a history of nonischemic DCM EF 40-45% presumed idiopathic, Class 2 Chronic systolic CHF and HTN who presents today for followup.  She is doing well.  She denies any SOB, DOE, LE edema, dizziness, palpitations or syncope.  She has had some problems with waking up with chest pain that she cannot describe that radiates into her back.  She sits up and stretches and it eventually goes away.  She denies any diaphoresis, nausea or SOB and it sometimes resolves with passing gas.     Wt Readings from Last 3 Encounters:  05/26/13 171 lb (77.565 kg)     Past Medical History  Diagnosis Date  . Hypertension   . Hypercholesteremia   . Nonischemic cardiomyopathy     LVEF 40-45%, presumed idiopathic by echo 05/2012  . Chronic systolic congestive heart failure, NYHA class 2     Current Outpatient Prescriptions  Medication Sig Dispense Refill  . amLODipine (NORVASC) 5 MG tablet Take 1 tablet (5 mg total) by mouth daily.  30 tablet  11  . carvedilol (COREG) 25 MG tablet Take 1 tablet (25 mg total) by mouth 2 (two) times daily with a meal.  60 tablet  11  . furosemide (LASIX) 20 MG tablet Take 20 mg by mouth daily.      . ramipril (ALTACE) 10 MG capsule Take 1 capsule (10 mg total) by mouth daily.  30 capsule  11   No current facility-administered medications for this visit.    Allergies:   No Known Allergies  Social History:  The patient  reports that she has never smoked. She does not have any smokeless tobacco history on file. She reports that she does not drink alcohol or use illicit drugs.   Family History:  The patient's family history includes CVA in her mother; Hypertension in her  brother, brother, father, mother, and sister.   ROS:  Please see the history of present illness.      All other systems reviewed and negative.   PHYSICAL EXAM: VS:  BP 120/80  Pulse 74  Ht 5\' 2"  (1.575 m)  Wt 171 lb (77.565 kg)  BMI 31.27 kg/m2 Well nourished, well developed, in no acute distress HEENT: normal Neck: no JVD Cardiac:  normal S1, S2; RRR; no murmur Lungs:  clear to auscultation bilaterally, no wheezing, rhonchi or rales Abd: soft, nontender, no hepatomegaly Ext: no edema Skin: warm and dry Neuro:  CNs 2-12 intact, no focal abnormalities noted  EKG:   NSR with LBBB    ASSESSMENT AND PLAN:  1. Chronic systolic CHF Class 2  - continue carvedilol/ramipril/Lasix - check BMET 2. Idiopathic DCM EF 40-45% 3.   HTN well controlled - continue Ramipril/Coreg/amlodipine 4.   Chronic LBBB 5.   Atypical chest pain that sounds more like GERD symptoms especially since it only occurs at night - Lexiscan myoview  Followup with me in 6 months  Signed, Fransico Him, MD 05/26/2013 9:03 AM

## 2013-05-26 NOTE — Patient Instructions (Signed)
Your physician recommends that you continue on your current medications as directed. Please refer to the Current Medication list given to you today.  Your physician recommends that you go to the lab today for a BMET Panel  Your physician has requested that you have a lexiscan myoview. For further information please visit [email or web address]. Please follow instruction sheet, as given.  Your physician wants you to follow-up in: 6 months with Dr Mallie Snooks will receive a reminder letter in the mail two months in advance. If you don't receive a letter, please call our office to schedule the follow-up appointment.

## 2013-06-09 ENCOUNTER — Ambulatory Visit (HOSPITAL_COMMUNITY): Payer: 59 | Attending: Cardiovascular Disease | Admitting: Radiology

## 2013-06-09 VITALS — BP 136/76 | HR 63 | Ht 62.0 in | Wt 169.0 lb

## 2013-06-09 DIAGNOSIS — I509 Heart failure, unspecified: Secondary | ICD-10-CM | POA: Insufficient documentation

## 2013-06-09 DIAGNOSIS — Z8249 Family history of ischemic heart disease and other diseases of the circulatory system: Secondary | ICD-10-CM | POA: Insufficient documentation

## 2013-06-09 DIAGNOSIS — I447 Left bundle-branch block, unspecified: Secondary | ICD-10-CM | POA: Insufficient documentation

## 2013-06-09 DIAGNOSIS — R079 Chest pain, unspecified: Secondary | ICD-10-CM

## 2013-06-09 DIAGNOSIS — R0789 Other chest pain: Secondary | ICD-10-CM | POA: Insufficient documentation

## 2013-06-09 MED ORDER — TECHNETIUM TC 99M SESTAMIBI GENERIC - CARDIOLITE
30.0000 | Freq: Once | INTRAVENOUS | Status: AC | PRN
  Administered 2013-06-09: 30 via INTRAVENOUS

## 2013-06-09 MED ORDER — ADENOSINE (DIAGNOSTIC) 3 MG/ML IV SOLN
0.5600 mg/kg | Freq: Once | INTRAVENOUS | Status: AC
  Administered 2013-06-09: 42.9 mg via INTRAVENOUS

## 2013-06-09 MED ORDER — TECHNETIUM TC 99M SESTAMIBI GENERIC - CARDIOLITE
10.0000 | Freq: Once | INTRAVENOUS | Status: AC | PRN
  Administered 2013-06-09: 10 via INTRAVENOUS

## 2013-06-09 NOTE — Progress Notes (Signed)
[place] [facility] NUCLEAR [facility] [address] [place], [place] [address] [phone]    Cardiology Nuclear Med Study  Terri [name] is a 53 y.o. female     MRN : [id number]     DOB: 1960-07-26  Procedure Date: 06/09/2013  Nuclear Med Background Indication for Stress Test:  Evaluation for Ischemia History:  No known CAD, NICM/CHF, Echo 2014 EF 40-45%, MPI 2014 (normal) EF 46% Cardiac Risk Factors: Family History - CAD, Hypertension, LBBB, Lipids and PVD  Symptoms:  Chest Pain (last date of chest discomfort was two weeks ago)   Nuclear Pre-Procedure Caffeine/Decaff Intake:  None > 12 hrs NPO After: 11:30pm   Lungs:  clear O2 Sat: 98% on room air. IV 0.9% NS with Angio Cath:  22g  IV Site: R Hand x 1, tolerated well IV Started by:  Irven Baltimore, RN  Chest Size (in):  36 Cup Size: G  Height: 5\' 2"  (1.575 m)  Weight:  169 lb (76.658 kg)  BMI:  Body mass index is 30.9 kg/(m^2). Tech Comments:  No Coreg or morning medications today.    Nuclear Med Study 1 or 2 day study: 1 day  Stress Test Type:  Adenosine  Reading MD: N/A  Order Authorizing Provider:  Fransico Him, MD  Resting Radionuclide: Technetium 15m Sestamibi  Resting Radionuclide Dose: 11.0 mCi   Stress Radionuclide:  Technetium 46m Sestamibi  Stress Radionuclide Dose: 33.0 mCi           Stress Protocol Rest HR: 63 Stress HR: 91  Rest BP: 136/76 Stress BP: 119/66  Exercise Time (min): n/a METS: n/a           Dose of Adenosine (mg):  43 mg Dose of Lexiscan: n/a mg  Dose of Atropine (mg): n/a Dose of Dobutamine: n/a mcg/kg/min (at max HR)  Stress Test Technologist: Glade Lloyd, BS-ES  Nuclear Technologist:  Annye Rusk, CNMT     Rest Procedure:  Myocardial perfusion imaging was performed at rest 45 minutes following the intravenous administration of Technetium 95m Sestamibi. Rest ECG: NSR-LBBB  Stress Procedure:  The patient received IV adenosine at 140 mcg/kg/min for 4 minutes.  Technetium 69m  Sestamibi was injected at the 2 minute mark and quantitative spect images were obtained after a 45 minute delay.   During the infusion of Adenosine, the patient complained of a headache, tight throat and legs hurting.  All symptoms resolved in recovery.  Stress ECG: No significant change from baseline ECG  QPS Raw Data Images:  Normal; no motion artifact; normal heart/lung ratio. Stress Images:  There is mild apical thinning with normal uptake in other regions. Rest Images:  There is mild apical thinning with normal uptake in other regions. Subtraction (SDS):  No evidence of ischemia. Transient Ischemic Dilatation (Normal <1.22):  1.10 Lung/Heart Ratio (Normal <0.45):  0.27  Quantitative Gated Spect Images QGS EDV:  134 ml QGS ESV:  76 ml  Impression Exercise Capacity:  Adenosine study with no exercise. BP Response:  Hypotensive blood pressure response. Clinical Symptoms:  No significant symptoms noted. ECG Impression:  No significant ECG changes with Lexiscan. Comparison with Prior Nuclear Study: No significant change from previous study  Overall Impression:  Low risk stress nuclear study with apical thinning defect. No reversible ischemia. Dilated LV..  LV Ejection Fraction: 43%.  LV Wall Motion:  Moderate global hypokinesis.  Pixie Casino, MD, [facility] Board Certified in Nuclear Cardiology Attending Cardiologist [facility]  3 

## 2013-06-24 ENCOUNTER — Other Ambulatory Visit: Payer: Self-pay | Admitting: General Surgery

## 2013-06-24 MED ORDER — PANTOPRAZOLE SODIUM 40 MG PO TBEC: 40.0000 mg | DELAYED_RELEASE_TABLET | Freq: Every day | ORAL | Status: DC

## 2013-09-18 ENCOUNTER — Encounter: Payer: Self-pay | Admitting: Cardiology

## 2014-02-05 ENCOUNTER — Encounter: Payer: Self-pay | Admitting: Cardiology

## 2014-02-05 ENCOUNTER — Ambulatory Visit (INDEPENDENT_AMBULATORY_CARE_PROVIDER_SITE_OTHER): Payer: 59 | Admitting: Cardiology

## 2014-02-05 VITALS — BP 132/90 | HR 79 | Ht 62.0 in | Wt 181.1 lb

## 2014-02-05 DIAGNOSIS — I10 Essential (primary) hypertension: Secondary | ICD-10-CM

## 2014-02-05 DIAGNOSIS — G4719 Other hypersomnia: Secondary | ICD-10-CM

## 2014-02-05 DIAGNOSIS — I428 Other cardiomyopathies: Secondary | ICD-10-CM

## 2014-02-05 DIAGNOSIS — I447 Left bundle-branch block, unspecified: Secondary | ICD-10-CM

## 2014-02-05 DIAGNOSIS — I5022 Chronic systolic (congestive) heart failure: Secondary | ICD-10-CM

## 2014-02-05 DIAGNOSIS — I429 Cardiomyopathy, unspecified: Secondary | ICD-10-CM

## 2014-02-05 LAB — BASIC METABOLIC PANEL
BUN: 9 mg/dL (ref 6–23)
CHLORIDE: 107 meq/L (ref 96–112)
CO2: 25 meq/L (ref 19–32)
CREATININE: 0.8 mg/dL (ref 0.4–1.2)
Calcium: 8.8 mg/dL (ref 8.4–10.5)
GFR: 92.23 mL/min (ref >60.00)
Glucose, Bld: 116 mg/dL — ABNORMAL HIGH (ref 70–99)
Potassium: 3.9 mEq/L (ref 3.5–5.1)
Sodium: 136 mEq/L (ref 135–145)

## 2014-02-05 MED ORDER — AMLODIPINE BESYLATE 5 MG PO TABS: 5.0000 mg | ORAL_TABLET | Freq: Every day | ORAL | Status: DC

## 2014-02-05 MED ORDER — CARVEDILOL 25 MG PO TABS: 25.0000 mg | ORAL_TABLET | Freq: Two times a day (BID) | ORAL | Status: DC

## 2014-02-05 MED ORDER — FUROSEMIDE 20 MG PO TABS: 20.0000 mg | ORAL_TABLET | Freq: Every day | ORAL | Status: DC

## 2014-02-05 NOTE — Progress Notes (Signed)
  [place], [place],   [address] Phone: [phone] Fax:  [phone]  Date:  02/05/2014   ID:  Terri [name], DOB 04-25-1960, MRN [id number]  PCP:  Vena Austria, MD  Cardiologist:  Fransico Him, MD    History of Present Illness: Terri [name] is a 53 y.o. female with a history of nonischemic DCM EF 40-45% presumed idiopathic, Class 2 Chronic systolic CHF and HTN who presents today for followup. She is doing well. She denies any SOB, DOE, LE edema, dizziness, palpitations or syncope. She has not been sleeping well at night.  She wakes up a lot from hot flashes.  She has been told that she snores mildly.  She has nonrestorative sleep and feels very tired during the day and falls asleep easily at work.  Wt Readings from Last 3 Encounters:  02/05/14 181 lb 1.9 oz (82.155 kg)  06/09/13 169 lb (76.658 kg)  05/26/13 171 lb (77.565 kg)     Past Medical History  Diagnosis Date  . Hypertension   . Hypercholesteremia   . Nonischemic cardiomyopathy     LVEF 40-45%, presumed idiopathic by echo 05/2012  . Chronic systolic congestive heart failure, NYHA class 2     Current Outpatient Prescriptions  Medication Sig Dispense Refill  . amLODipine (NORVASC) 5 MG tablet Take 1 tablet (5 mg total) by mouth daily. 30 tablet 11  . carvedilol (COREG) 25 MG tablet Take 1 tablet (25 mg total) by mouth 2 (two) times daily with a meal. 60 tablet 11  . furosemide (LASIX) 20 MG tablet Take 20 mg by mouth daily.    . pantoprazole (PROTONIX) 40 MG tablet Take 1 tablet (40 mg total) by mouth daily. 90 tablet 3  . ramipril (ALTACE) 10 MG capsule Take 1 capsule (10 mg total) by mouth daily. 30 capsule 11   No current facility-administered medications for this visit.    Allergies:   No Known Allergies  Social History:  The patient  reports that she has never smoked. She does not have any smokeless tobacco history on file. She reports that she does not drink alcohol or use  illicit drugs.   Family History:  The patient's family history includes CVA in her mother; Hypertension in her brother, brother, father, mother, and sister.   ROS:  Please see the history of present illness.      All other systems reviewed and negative.   PHYSICAL EXAM: VS:  BP 132/90 mmHg  Pulse 79  Ht 5\' 2"  (1.575 m)  Wt 181 lb 1.9 oz (82.155 kg)  BMI 33.12 kg/m2 Well nourished, well developed, in no acute distress HEENT: normal Neck: no JVD Cardiac:  normal S1, S2; RRR; no murmur Lungs:  clear to auscultation bilaterally, no wheezing, rhonchi or rales Abd: soft, nontender, no hepatomegaly Ext: no edema Skin: warm and dry Neuro:  CNs 2-12 intact, no focal abnormalities noted  ASSESSMENT AND PLAN:  1. Chronic systolic CHF Class 2 - continue carvedilol/ramipril/Lasix - check BMET 2. Idiopathic DCM EF 40-45% 3. HTN borderline  - she has not taken her meds today.  She ran out of her meds several days ago.  We will get these refilled - continue Ramipril/Coreg/amlodipine 4. Chronic LBBB 5.  Excessive daytime sleepiness with snoring - will get a split night PSG to rule out OSA  Followup with me in 6 months        Signed, Fransico Him, MD [facility] 02/05/2014 9:14 AM

## 2014-02-05 NOTE — Patient Instructions (Signed)
Your physician has recommended that you have a sleep study. This test records several body functions during sleep, including: brain activity, eye movement, oxygen and carbon dioxide blood levels, heart rate and rhythm, breathing rate and rhythm, the flow of air through your mouth and nose, snoring, body muscle movements, and chest and belly movement.  Your physician recommends that have lab work TODAY (BMET).  Your physician wants you to follow-up in: 6 months with Dr. Radford Pax. You will receive a reminder letter in the mail two months in advance. If you don't receive a letter, please call our office to schedule the follow-up appointment.

## 2014-02-06 ENCOUNTER — Other Ambulatory Visit: Payer: Self-pay | Admitting: *Deleted

## 2014-02-06 DIAGNOSIS — I5022 Chronic systolic (congestive) heart failure: Secondary | ICD-10-CM

## 2014-02-06 MED ORDER — RAMIPRIL 10 MG PO CAPS: 10.0000 mg | ORAL_CAPSULE | Freq: Every day | ORAL | Status: DC

## 2014-04-24 ENCOUNTER — Ambulatory Visit (HOSPITAL_BASED_OUTPATIENT_CLINIC_OR_DEPARTMENT_OTHER): Payer: 59 | Attending: Cardiology

## 2014-04-24 VITALS — Ht 62.0 in | Wt 180.0 lb

## 2014-04-24 DIAGNOSIS — G4733 Obstructive sleep apnea (adult) (pediatric): Secondary | ICD-10-CM | POA: Diagnosis not present

## 2014-04-24 DIAGNOSIS — G4719 Other hypersomnia: Secondary | ICD-10-CM | POA: Diagnosis not present

## 2014-04-29 ENCOUNTER — Telehealth: Payer: Self-pay | Admitting: Cardiology

## 2014-04-29 ENCOUNTER — Telehealth: Payer: Self-pay | Admitting: *Deleted

## 2014-04-29 NOTE — Sleep Study (Signed)
2  NAME: Terri [name] DATE OF BIRTH:  [birth year] MEDICAL RECORD NUMBER [id number]  [facility]  PHYSICIAN: Nicol Herbig R  DATE OF STUDY: 04/24/2014  SLEEP STUDY TYPE: Nocturnal Polysomnogram               REFERRING PHYSICIAN: Sueanne Margarita, MD  INDICATION FOR STUDY: snoring with excessive daytime sleepiness  EPWORTH SLEEPINESS SCORE: 16 HEIGHT: 5\' 2"  (157.5 cm)  WEIGHT: 180 lb (81.647 kg)    Body mass index is 32.91 kg/(m^2).  NECK SIZE: 16 in.  MEDICATIONS: Reviewed in the chart  SLEEP ARCHITECTURE: The patient slept for a total of 268 minutes out of a total sleep period of 338 minutes.  There was 0.5 minutes of slow wave sleep and 25 minutes of REM sleep.  The onset to sleep latency was 32 minutes and onset to REM sleep latency was prolonged at 199 minutes.  The sleep efficiency was reduced at 72%.  Increased frequency or arousals was noted due to spontaneous arousals.    RESPIRATORY DATA: The patient had no apneas but did have 17 hypopneas noted mainly in the supine position during REM sleep.  The AHI was normal at 3.8 events per hour.  There was mild to moderate snoring noted.  OXYGEN DATA: The lowest oxygen saturation during sleep was 90%.    CARDIAC DATA: The patient maintained NSR with an average heart rate of 71 bpm.  The maximum heart rate was 179 bpm and lowest heart rate was 37 bpm.    MOVEMENT/PARASOMNIA: There were mild periodic limb movements with a PLMS index of 4.7 events per hour.  There were no REM behavior sleep disorders.  IMPRESSION/ RECOMMENDATION:   1.  No significant obstructive sleep apnea/hypopnea was noted.  The AHI was within normal limits at 3.8 events per hour.  Most events occurred in  REM supine sleep. 2.  Mild to moderate snoring was noted.   3.  No significant oxygen desaturations were noted. 4.  Reduced sleep efficiency with increased frequency of spontaneous arousals. 5.  Mild periodic limb movements noted  which did not result in arousals. 6.  No indication at this time for CPAP therapy.  Could consider ENT referral for surgical causes of snoring. 7.  The patient should be counseled in good sleep hygiene and weight loss. 8.  The patient should be counseled to avoid sleeping supine.   Signed: Sueanne Margarita Diplomate, American Board of Sleep Medicine  ELECTRONICALLY SIGNED ON:  04/29/2014, 2:41 PM [place] PH: [phone]   FX: [phone] [place]

## 2014-04-29 NOTE — Telephone Encounter (Signed)
Please let patient know that she does not have any signficant OSA>  SHe has mild to moderate snoring so could refer to ENT if she prefers.  She needs to try to go to bed on time and get 8 hours of sleep to avoid daytime sleepiness.  Would avoid sleeping supine since the events she did have occurred in supine position.

## 2014-04-30 NOTE — Telephone Encounter (Signed)
New Msg         Pt calling, returning call from yesterday.   Please call back.

## 2014-05-01 NOTE — Telephone Encounter (Signed)
Patient informed of sleep study results and verbal understanding expressed.   Patient refuses ENT referral at this time. Instructed patient to try to go to bed on time and get 8 hours of sleep to avoid daytime sleepiness. Instructed patient to avoid sleeping supine. Patient agrees with treatment plan.

## 2014-05-07 ENCOUNTER — Encounter: Payer: Self-pay | Admitting: Cardiology

## 2014-07-10 ENCOUNTER — Other Ambulatory Visit: Payer: Self-pay

## 2014-07-10 DIAGNOSIS — I5022 Chronic systolic (congestive) heart failure: Secondary | ICD-10-CM

## 2014-07-10 MED ORDER — PANTOPRAZOLE SODIUM 40 MG PO TBEC: 40.0000 mg | DELAYED_RELEASE_TABLET | Freq: Every day | ORAL | Status: DC

## 2014-07-10 MED ORDER — FUROSEMIDE 20 MG PO TABS: 20.0000 mg | ORAL_TABLET | Freq: Every day | ORAL | Status: DC

## 2014-07-10 MED ORDER — AMLODIPINE BESYLATE 5 MG PO TABS: 5.0000 mg | ORAL_TABLET | Freq: Every day | ORAL | Status: DC

## 2014-07-10 MED ORDER — RAMIPRIL 10 MG PO CAPS: 10.0000 mg | ORAL_CAPSULE | Freq: Every day | ORAL | Status: DC

## 2014-07-10 MED ORDER — CARVEDILOL 25 MG PO TABS: 25.0000 mg | ORAL_TABLET | Freq: Two times a day (BID) | ORAL | Status: DC

## 2014-09-11 ENCOUNTER — Ambulatory Visit (INDEPENDENT_AMBULATORY_CARE_PROVIDER_SITE_OTHER): Payer: 59 | Admitting: Cardiology

## 2014-09-11 ENCOUNTER — Encounter: Payer: Self-pay | Admitting: Cardiology

## 2014-09-11 VITALS — BP 124/72 | HR 66 | Ht 62.0 in | Wt 171.0 lb

## 2014-09-11 DIAGNOSIS — I429 Cardiomyopathy, unspecified: Secondary | ICD-10-CM | POA: Diagnosis not present

## 2014-09-11 DIAGNOSIS — I5022 Chronic systolic (congestive) heart failure: Secondary | ICD-10-CM | POA: Diagnosis not present

## 2014-09-11 DIAGNOSIS — I10 Essential (primary) hypertension: Secondary | ICD-10-CM

## 2014-09-11 DIAGNOSIS — I428 Other cardiomyopathies: Secondary | ICD-10-CM

## 2014-09-11 LAB — BASIC METABOLIC PANEL
BUN: 10 mg/dL (ref 6–23)
CALCIUM: 9.2 mg/dL (ref 8.4–10.5)
CHLORIDE: 107 meq/L (ref 96–112)
CO2: 28 mEq/L (ref 19–32)
CREATININE: 0.94 mg/dL (ref 0.40–1.20)
GFR: 79.71 mL/min (ref >60.00)
Glucose, Bld: 105 mg/dL — ABNORMAL HIGH (ref 70–99)
POTASSIUM: 3.4 meq/L — AB (ref 3.5–5.1)
Sodium: 141 mEq/L (ref 135–145)

## 2014-09-11 NOTE — Progress Notes (Signed)
Cardiology Office Note   Date:  09/11/2014   ID:  Terri [name], DOB December 09, 1960, MRN [id number]  PCP:  Terri Austria, MD    Chief Complaint  Patient presents with  . Cardiomyopathy  . Congestive Heart Failure  . Hypertension      History of Present Illness: Terri [name] is a 54 y.o. female with a history of nonischemic DCM EF 40-45% presumed idiopathic, Class 2 Chronic systolic CHF and HTN who presents today for followup. She is doing well. She denies any chest pain, SOB, DOE, LE edema, dizziness, palpitations or syncope.  When I last saw her, she said she had been told that she snores mildly. She also complained of nonrestorative sleep and feeling very tired during the day and falling asleep easily at work.  She underwent sleep study that did not show any significant sleep apnea.      Past Medical History  Diagnosis Date  . Hypertension   . Hypercholesteremia   . Nonischemic cardiomyopathy     LVEF 40-45%, presumed idiopathic by echo 05/2012  . Chronic systolic congestive heart failure, NYHA class 2     Past Surgical History  Procedure Laterality Date  . Cardiac catheterization  2010    normal coronary arteries  . Femoral artery stent  06/2010    placement of stent in femoral artery for chronic LE edema     Current Outpatient Prescriptions  Medication Sig Dispense Refill  . amLODipine (NORVASC) 5 MG tablet Take 1 tablet (5 mg total) by mouth daily. 30 tablet 2  . carvedilol (COREG) 25 MG tablet Take 1 tablet (25 mg total) by mouth 2 (two) times daily with a meal. 60 tablet 2  . furosemide (LASIX) 20 MG tablet Take 1 tablet (20 mg total) by mouth daily. 30 tablet 2  . pantoprazole (PROTONIX) 40 MG tablet Take 1 tablet (40 mg total) by mouth daily. 30 tablet 2  . ramipril (ALTACE) 10 MG capsule Take 1 capsule (10 mg total) by mouth daily. 30 capsule 2   No current facility-administered medications for this visit.    Allergies:    Review of patient's allergies indicates no known allergies.    Social History:  The patient  reports that she has never smoked. She does not have any smokeless tobacco history on file. She reports that she does not drink alcohol or use illicit drugs.   Family History:  The patient's family history includes CVA in her mother; Hypertension in her brother, brother, father, mother, and sister.    ROS:  Please see the history of present illness.   Otherwise, review of systems are positive for none.   All other systems are reviewed and negative.    PHYSICAL EXAM: VS:  BP 124/72 mmHg  Pulse 66  Ht 5\' 2"  (1.575 m)  Wt 171 lb (77.565 kg)  BMI 31.27 kg/m2 , BMI Body mass index is 31.27 kg/(m^2). GEN: Well nourished, well developed, in no acute distress HEENT: normal Neck: no JVD, carotid bruits, or masses Cardiac: RRR; no murmurs, rubs, or gallops,no edema  Respiratory:  clear to auscultation bilaterally, normal work of breathing GI: soft, nontender, nondistended, + BS MS: no deformity or atrophy Skin: warm and dry, no rash Neuro:  Strength and sensation are intact Psych: euthymic mood, full affect   EKG:  EKG was ordered today and showed normal sinus with LBBB  Recent Labs: 02/05/2014: BUN 9; Creatinine, Ser 0.8; Potassium 3.9; Sodium 136    Lipid Panel No results found for: CHOL, TRIG, HDL, CHOLHDL, VLDL, LDLCALC, LDLDIRECT    Wt Readings from Last 3 Encounters:  09/11/14 171 lb (77.565 kg)  04/24/14 180 lb (81.647 kg)  02/05/14 181 lb 1.9 oz (82.155 kg)    ASSESSMENT AND PLAN:  1. Chronic systolic CHF Class 2 - euvolemic on exam today - continue carvedilol/ramipril/Lasix - check BMET 2. Idiopathic DCM EF 40-45%   3.     HTN controlled - continue Ramipril/Coreg/amlodipine   4.     Chronic LBBB   5.      Excessive daytime sleepiness with snoring - no OSA on PSG   Current medicines are reviewed at length with the patient today.  The patient does not have  concerns regarding medicines.  The following changes have been made:  no change  Labs/ tests ordered today: See above Assessment and Plan No orders of the defined types were placed in this encounter.     Disposition:   FU with me in 6 months  Signed, Sueanne Margarita, MD  09/11/2014 3:29 PM    [facility] [address], [place], [place]  [address] Phone: [phone]; Fax: [phone]

## 2014-09-11 NOTE — Patient Instructions (Signed)

## 2014-09-14 ENCOUNTER — Encounter: Payer: Self-pay | Admitting: Cardiology

## 2014-09-14 ENCOUNTER — Telehealth: Payer: Self-pay

## 2014-09-14 DIAGNOSIS — I10 Essential (primary) hypertension: Secondary | ICD-10-CM

## 2014-09-14 MED ORDER — POTASSIUM CHLORIDE ER 20 MEQ PO TBCR: 20.0000 meq | EXTENDED_RELEASE_TABLET | Freq: Every day | ORAL | Status: DC

## 2014-09-14 NOTE — Telephone Encounter (Signed)
This encounter was created in error - please disregard.

## 2014-09-14 NOTE — Telephone Encounter (Signed)
Notes Recorded by Theodoro Parma, RN on 09/14/2014 at 11:33 AM Informed patient of lab results and verbal understanding expressed.  Instructed patient to START KDUR 20 meq daily. Repeat BMET scheduled for next Friday.  Patient agrees with treatment plan.

## 2014-09-14 NOTE — Telephone Encounter (Signed)
New message     Pt returning your call. Please call to discuss

## 2014-09-25 ENCOUNTER — Other Ambulatory Visit: Payer: 59

## 2014-09-25 ENCOUNTER — Telehealth: Payer: Self-pay | Admitting: Cardiology

## 2014-09-25 NOTE — Telephone Encounter (Signed)
Called patient.  She is picking up potasium prescription today.  Patient would like to know when she needs to have her lab work re-done  Routing to Dr. Radford Pax

## 2014-09-25 NOTE — Telephone Encounter (Signed)
New message      Pt was not able to get presc for potassium because of cost.  She will pick up presc today.  When should she resc her labs?

## 2014-09-26 NOTE — Telephone Encounter (Signed)
1 week 

## 2014-09-28 NOTE — Telephone Encounter (Signed)
Left message to call back to reschedule lab work to this Friday.

## 2014-09-29 NOTE — Telephone Encounter (Signed)
Left message to call back  

## 2014-09-29 NOTE — Telephone Encounter (Signed)
Confirmed with patient she is to come for repeat labwork Friday.

## 2014-10-02 ENCOUNTER — Other Ambulatory Visit (INDEPENDENT_AMBULATORY_CARE_PROVIDER_SITE_OTHER): Payer: 59 | Admitting: *Deleted

## 2014-10-02 DIAGNOSIS — I10 Essential (primary) hypertension: Secondary | ICD-10-CM | POA: Diagnosis not present

## 2014-10-02 LAB — BASIC METABOLIC PANEL
BUN: 10 mg/dL (ref 6–23)
CO2: 28 meq/L (ref 19–32)
Calcium: 9.1 mg/dL (ref 8.4–10.5)
Chloride: 106 mEq/L (ref 96–112)
Creatinine, Ser: 0.9 mg/dL (ref 0.40–1.20)
GFR: 83.79 mL/min (ref >60.00)
GLUCOSE: 103 mg/dL — AB (ref 70–99)
Potassium: 4 mEq/L (ref 3.5–5.1)
Sodium: 139 mEq/L (ref 135–145)

## 2014-10-02 NOTE — Addendum Note (Signed)
Addended by: Domenica Reamer R on: 10/02/2014 07:37 AM   Modules accepted: Orders

## 2014-10-02 NOTE — Addendum Note (Signed)
Addended by: Andres Ege on: 10/02/2014 07:36 AM   Modules accepted: Orders

## 2014-12-14 ENCOUNTER — Other Ambulatory Visit: Payer: Self-pay

## 2014-12-14 MED ORDER — PANTOPRAZOLE SODIUM 40 MG PO TBEC: 40.0000 mg | DELAYED_RELEASE_TABLET | Freq: Every day | ORAL | Status: DC

## 2014-12-31 ENCOUNTER — Encounter: Payer: Self-pay | Admitting: Cardiology

## 2015-03-10 ENCOUNTER — Other Ambulatory Visit: Payer: Self-pay | Admitting: Cardiology

## 2015-03-12 ENCOUNTER — Other Ambulatory Visit: Payer: Self-pay | Admitting: Cardiology

## 2015-05-12 ENCOUNTER — Encounter: Payer: Self-pay | Admitting: Cardiology

## 2015-05-12 NOTE — Progress Notes (Signed)
Cardiology Office Note   Date:  05/13/2015   ID:  Terri [name], DOB 09-Dec-1960, MRN [id number]  PCP:  Vena Austria, MD    Chief Complaint  Patient presents with  . Congestive Heart Failure    no sx  . Cardiomyopathy  . Hypertension      History of Present Illness: Terri [name] is a 55 y.o. female with a history of nonischemic DCM (EF 40-45%) presumed idiopathic, Class 2 Chronic systolic CHF and HTN who presents today for followup. She is doing well. She denies any chest pain, SOB, DOE, dizziness, palpitations or syncope. She occasionally has some LE edema from time to time.   Past Medical History  Diagnosis Date  . Hypertension   . Hypercholesteremia   . Nonischemic cardiomyopathy (HCC)     LVEF 40-45%, presumed idiopathic by echo 05/2012  . Chronic systolic congestive heart failure, NYHA class 2 (HCC)     Past Surgical History  Procedure Laterality Date  . Cardiac catheterization  2010    normal coronary arteries  . Femoral artery stent  06/2010    placement of stent in femoral artery for chronic LE edema     Current Outpatient Prescriptions  Medication Sig Dispense Refill  . amLODipine (NORVASC) 5 MG tablet Take 1 tablet (5 mg total) by mouth daily. 30 tablet 2  . carvedilol (COREG) 25 MG tablet Take 1 tablet (25 mg total) by mouth 2 (two) times daily with a meal. 60 tablet 2  . furosemide (LASIX) 20 MG tablet Take 1 tablet (20 mg total) by mouth daily. 30 tablet 2  . pantoprazole (PROTONIX) 40 MG tablet Take 1 tablet (40 mg total) by mouth daily. 30 tablet 2  . potassium chloride 20 MEQ TBCR Take 20 mEq by mouth daily. 30 tablet 11  . ramipril (ALTACE) 10 MG capsule Take 1 capsule (10 mg total) by mouth daily. 30 capsule 2   No current facility-administered medications for this visit.    Allergies:   Review of patient's allergies indicates no known allergies.    Social History:  The patient  reports that she has never  smoked. She does not have any smokeless tobacco history on file. She reports that she does not drink alcohol or use illicit drugs.   Family History:  The patient's family history includes CVA in her mother; Hypertension in her brother, brother, father, mother, and sister.    ROS:  Please see the history of present illness.   Otherwise, review of systems are positive for none.   All other systems are reviewed and negative.    PHYSICAL EXAM: VS:  BP 134/80 mmHg  Pulse 72  Ht 5\' 2"  (1.575 m)  Wt 179 lb 6.4 oz (81.375 kg)  BMI 32.80 kg/m2  SpO2 90% , BMI Body mass index is 32.8 kg/(m^2). GEN: Well nourished, well developed, in no acute distress HEENT: normal Neck: no JVD, carotid bruits, or masses Cardiac: RRR; no murmurs, rubs, or gallops,no edema  Respiratory:  clear to auscultation bilaterally, normal work of breathing GI: soft, nontender, nondistended, + BS MS: no deformity or atrophy Skin: warm and dry, no rash Neuro:  Strength and sensation are intact Psych: euthymic mood, full affect   EKG:  EKG is not ordered today.    Recent Labs: 10/02/2014: BUN 10; Creatinine, Ser 0.90; Potassium 4.0; Sodium 139    Lipid Panel No results found  for: CHOL, TRIG, HDL, CHOLHDL, VLDL, LDLCALC, LDLDIRECT    Wt Readings from Last 3 Encounters:  05/13/15 179 lb 6.4 oz (81.375 kg)  09/11/14 171 lb (77.565 kg)  04/24/14 180 lb (81.647 kg)     ASSESSMENT AND PLAN: 1.  Chronic systolic CHF Class 2 - euvolemic on exam today - continue carvedilol/ramipril/Lasix - check BMET 2.  Idiopathic DCM EF 40-45% 3.  HTN controlled - continue Ramipril/Coreg/amlodipine 4.  Chronic LBBB   Current medicines are reviewed at length with the patient today.  The patient does not have concerns regarding medicines.  The following changes have been made:  no change  Labs/ tests ordered today: See above Assessment and Plan No orders of the defined types were placed in this encounter.     Disposition:    FU with me in 6 months  Signed, Sueanne Margarita, MD  05/13/2015 8:47 AM    [facility] [address] [place], [place], [place]  [address] Phone: [phone]; Fax: [phone]

## 2015-05-13 ENCOUNTER — Ambulatory Visit (INDEPENDENT_AMBULATORY_CARE_PROVIDER_SITE_OTHER): Payer: 59 | Admitting: Cardiology

## 2015-05-13 ENCOUNTER — Encounter: Payer: Self-pay | Admitting: Cardiology

## 2015-05-13 VITALS — BP 134/80 | HR 72 | Ht 62.0 in | Wt 179.4 lb

## 2015-05-13 DIAGNOSIS — I10 Essential (primary) hypertension: Secondary | ICD-10-CM

## 2015-05-13 DIAGNOSIS — I447 Left bundle-branch block, unspecified: Secondary | ICD-10-CM | POA: Diagnosis not present

## 2015-05-13 DIAGNOSIS — I5022 Chronic systolic (congestive) heart failure: Secondary | ICD-10-CM

## 2015-05-13 DIAGNOSIS — I428 Other cardiomyopathies: Secondary | ICD-10-CM

## 2015-05-13 DIAGNOSIS — I429 Cardiomyopathy, unspecified: Secondary | ICD-10-CM

## 2015-05-13 LAB — BASIC METABOLIC PANEL
BUN: 11 mg/dL (ref 7–25)
CHLORIDE: 106 mmol/L (ref 98–110)
CO2: 27 mmol/L (ref 20–31)
CREATININE: 0.87 mg/dL (ref 0.50–1.05)
Calcium: 9.1 mg/dL (ref 8.6–10.4)
Glucose, Bld: 94 mg/dL (ref 65–99)
POTASSIUM: 4.2 mmol/L (ref 3.5–5.3)
Sodium: 141 mmol/L (ref 135–146)

## 2015-05-13 MED ORDER — PANTOPRAZOLE SODIUM 40 MG PO TBEC: 40.0000 mg | DELAYED_RELEASE_TABLET | Freq: Every day | ORAL | Status: DC

## 2015-05-13 MED ORDER — FUROSEMIDE 20 MG PO TABS: 20.0000 mg | ORAL_TABLET | Freq: Every day | ORAL | Status: DC

## 2015-05-13 MED ORDER — RAMIPRIL 10 MG PO CAPS
10.0000 mg | ORAL_CAPSULE | Freq: Every day | ORAL | Status: DC
Start: 2015-05-13 — End: 2016-01-05

## 2015-05-13 MED ORDER — AMLODIPINE BESYLATE 5 MG PO TABS: 5.0000 mg | ORAL_TABLET | Freq: Every day | ORAL | Status: DC

## 2015-05-13 MED ORDER — CARVEDILOL 25 MG PO TABS: 25.0000 mg | ORAL_TABLET | Freq: Two times a day (BID) | ORAL | Status: DC

## 2015-05-13 NOTE — Patient Instructions (Signed)

## 2015-05-14 ENCOUNTER — Telehealth: Payer: Self-pay | Admitting: Cardiology

## 2015-05-14 NOTE — Telephone Encounter (Signed)
Follow up      Calling to talk to the nurse.  Please call before you leave today

## 2015-05-14 NOTE — Telephone Encounter (Signed)
New message      Returning a call to the nurse.  Pt states her phone is acting up.  The first time you call all messages go directly to her vm.  Hang up and call back then her phone will ring

## 2015-05-14 NOTE — Telephone Encounter (Signed)
-----   Message from Sueanne Margarita, MD sent at 05/14/2015  8:20 AM EDT ----- Please let patient know that labs were normal.  Continue current medical therapy.

## 2015-05-14 NOTE — Telephone Encounter (Signed)
Informed patient of results and verbal understanding expressed.  

## 2015-10-13 ENCOUNTER — Other Ambulatory Visit: Payer: Self-pay | Admitting: Cardiology

## 2015-11-18 ENCOUNTER — Ambulatory Visit: Payer: 59 | Admitting: Cardiology

## 2015-11-26 ENCOUNTER — Encounter: Payer: Self-pay | Admitting: Cardiology

## 2015-11-26 ENCOUNTER — Encounter (INDEPENDENT_AMBULATORY_CARE_PROVIDER_SITE_OTHER): Payer: Self-pay

## 2015-11-26 ENCOUNTER — Ambulatory Visit (INDEPENDENT_AMBULATORY_CARE_PROVIDER_SITE_OTHER): Payer: 59 | Admitting: Cardiology

## 2015-11-26 VITALS — BP 116/80 | HR 67 | Ht 62.0 in | Wt 181.1 lb

## 2015-11-26 DIAGNOSIS — I428 Other cardiomyopathies: Secondary | ICD-10-CM

## 2015-11-26 DIAGNOSIS — I10 Essential (primary) hypertension: Secondary | ICD-10-CM

## 2015-11-26 DIAGNOSIS — I5022 Chronic systolic (congestive) heart failure: Secondary | ICD-10-CM | POA: Diagnosis not present

## 2015-11-26 DIAGNOSIS — I429 Cardiomyopathy, unspecified: Secondary | ICD-10-CM

## 2015-11-26 LAB — BASIC METABOLIC PANEL
BUN: 8 mg/dL (ref 7–25)
CALCIUM: 9.2 mg/dL (ref 8.6–10.4)
CO2: 26 mmol/L (ref 20–31)
Chloride: 107 mmol/L (ref 98–110)
Creat: 0.81 mg/dL (ref 0.50–1.05)
GLUCOSE: 90 mg/dL (ref 65–99)
Potassium: 4.4 mmol/L (ref 3.5–5.3)
Sodium: 140 mmol/L (ref 135–146)

## 2015-11-26 NOTE — Patient Instructions (Signed)
Medication Instructions:  Your physician recommends that you continue on your current medications as directed. Please refer to the Current Medication list given to you today.   Labwork: BMET today  Testing/Procedures: Your physician has requested that you have an echocardiogram. Echocardiography is a painless test that uses sound waves to create images of your heart. It provides your doctor with information about the size and shape of your heart and how well your heart's chambers and valves are working. This procedure takes approximately one hour. There are no restrictions for this procedure.    Follow-Up: Your physician wants you to follow-up in: 6 months with Dr Radford Pax. (March 2018).  You will receive a reminder letter in the mail two months in advance. If you don't receive a letter, please call our office to schedule the follow-up appointment.        If you need a refill on your cardiac medications before your next appointment, please call your pharmacy.

## 2015-11-26 NOTE — Progress Notes (Signed)
Cardiology Office Note    Date:  11/26/2015   ID:  [name], DOB 04-Oct-1960, MRN [id number]  PCP:  Vena Austria, MD  Cardiologist:  Fransico Him, MD   Chief Complaint  Patient presents with  . Cardiomyopathy  . Congestive Heart Failure  . Hypertension    History of Present Illness:  Terri [name] is a 55 y.o. female  with a history of nonischemic DCM (EF 40-45%) presumed idiopathic, Class 2 Chronic systolic CHF and HTN who presents today for followup. She is doing well. She denies any chest pain, SOB, DOE, PND, orthopnea, dizziness, palpitations or syncope. She occasionally has some LE edema from time to time and is stable.  She has gained 10lbs in the past year but only 2 lbs from March.  She says that she works out at [employer] 5 days weekly doing the [employer], treadmill and elliptical.     Past Medical History:  Diagnosis Date  . Chronic systolic congestive heart failure, NYHA class 2 ([place])   . Hypercholesteremia   . Hypertension   . Nonischemic cardiomyopathy (HCC)    LVEF 40-45%, presumed idiopathic by echo 05/2012    Past Surgical History:  Procedure Laterality Date  . CARDIAC CATHETERIZATION  2010   normal coronary arteries  . FEMORAL ARTERY STENT  06/2010   placement of stent in femoral artery for chronic LE edema    Current Medications: Outpatient Medications Prior to Visit  Medication Sig Dispense Refill  . amLODipine (NORVASC) 5 MG tablet Take 1 tablet (5 mg total) by mouth Terri. 90 tablet 3  . carvedilol (COREG) 25 MG tablet Take 1 tablet (25 mg total) by mouth 2 (two) times Terri with a meal. 180 tablet 3  . furosemide (LASIX) 20 MG tablet Take 1 tablet (20 mg total) by mouth Terri. 90 tablet 3  . pantoprazole (PROTONIX) 40 MG tablet Take 1 tablet (40 mg total) by mouth Terri. 90 tablet 3  . Potassium Chloride ER 20 MEQ TBCR Take 1 tablet by mouth Terri. 90 tablet 2  . ramipril (ALTACE) 10 MG capsule Take 1 capsule (10 mg total) by  mouth Terri. 90 capsule 3   No facility-administered medications prior to visit.      Allergies:   Review of patient's allergies indicates no known allergies.   Social History   Social History  . Marital status: Widowed    Spouse name: N/A  . Number of children: N/A  . Years of education: N/A   Social History Main Topics  . Smoking status: Never Smoker  . Smokeless tobacco: None  . Alcohol use No  . Drug use: No  . Sexual activity: Not Asked   Other Topics Concern  . None   Social History Narrative  . None     Family History:  The patient's family history includes CVA in her mother; Hypertension in her brother, brother, father, mother, and sister.   ROS:   Please see the history of present illness.    ROS All other systems reviewed and are negative.  No flowsheet data found.     PHYSICAL EXAM:   VS:  BP 116/80 (BP Location: Right Arm, Patient Position: Sitting, Cuff Size: Normal)   Pulse 67   Ht 5\' 2"  (1.575 m)   Wt 181 lb 1.9 oz (82.2 kg)   BMI 33.13 kg/m    GEN: Well nourished, well developed, in no acute distress  HEENT: normal  Neck: no JVD, carotid bruits, or  masses Cardiac: RRR; no murmurs, rubs, or gallops,no edema.  Intact distal pulses bilaterally.  Respiratory:  clear to auscultation bilaterally, normal work of breathing GI: soft, nontender, nondistended, + BS MS: no deformity or atrophy  Skin: warm and dry, no rash Neuro:  Alert and Oriented x 3, Strength and sensation are intact Psych: euthymic mood, full affect  Wt Readings from Last 3 Encounters:  11/26/15 181 lb 1.9 oz (82.2 kg)  05/13/15 179 lb 6.4 oz (81.4 kg)  09/11/14 171 lb (77.6 kg)      Studies/Labs Reviewed:   EKG:  EKG is ordered today.  The ekg ordered today demonstrates NSR at 67bpm with nonspecific IVCD unchanged from prior EKG a year ago  Recent Labs: 05/13/2015: BUN 11; Creat 0.87; Potassium 4.2; Sodium 141   Lipid Panel No results found for: CHOL, TRIG, HDL,  CHOLHDL, VLDL, LDLCALC, LDLDIRECT  Additional studies/ records that were reviewed today include:  none    ASSESSMENT:    1. Nonischemic cardiomyopathy ([place])   2. Essential hypertension, benign   3. Chronic systolic congestive heart failure, NYHA class 2 (HCC)      PLAN:  In order of problems listed above:  1. Nonischemic DCM EF 40-45% 2. HTN - BP controlled on current meds.  Continue ACE I/BB and amlodipine 3. Chronic  Systolic CHF - NYHA class IIa.  She appears euvolemic on exam today.  Her weight is up 10lb from last year but I do not think that this is fluid.  We talked about increasing the aerobic exercise time to 45 minutes at the gym and cutting back her portions and carbs.  Continue ACE I and BB. Check BMET today. I will repeat an echo to reassess LVF since it has been a while. If EF still 40-45% then will add on aldactone 12.5mg  Terri and consider changing ACE I to Entresto.    Medication Adjustments/Labs and Tests Ordered: Current medicines are reviewed at length with the patient today.  Concerns regarding medicines are outlined above.  Medication changes, Labs and Tests ordered today are listed in the Patient Instructions below.  There are no Patient Instructions on file for this visit.   Signed, Fransico Him, MD  11/26/2015 11:09 AM    [facility], [place], [place]  [address] Phone: [phone]; Fax: [phone]

## 2015-12-08 ENCOUNTER — Ambulatory Visit (HOSPITAL_COMMUNITY): Payer: 59 | Attending: Cardiovascular Disease

## 2015-12-08 ENCOUNTER — Other Ambulatory Visit: Payer: Self-pay

## 2015-12-08 DIAGNOSIS — I517 Cardiomegaly: Secondary | ICD-10-CM | POA: Diagnosis not present

## 2015-12-08 DIAGNOSIS — I5022 Chronic systolic (congestive) heart failure: Secondary | ICD-10-CM | POA: Diagnosis not present

## 2015-12-08 DIAGNOSIS — I10 Essential (primary) hypertension: Secondary | ICD-10-CM

## 2015-12-08 DIAGNOSIS — I428 Other cardiomyopathies: Secondary | ICD-10-CM | POA: Diagnosis not present

## 2015-12-08 DIAGNOSIS — I340 Nonrheumatic mitral (valve) insufficiency: Secondary | ICD-10-CM | POA: Insufficient documentation

## 2015-12-08 DIAGNOSIS — I501 Left ventricular failure: Secondary | ICD-10-CM | POA: Insufficient documentation

## 2015-12-10 ENCOUNTER — Telehealth: Payer: Self-pay

## 2015-12-10 MED ORDER — SPIRONOLACTONE 25 MG PO TABS: 25.0000 mg | ORAL_TABLET | Freq: Every day | ORAL | 11 refills | Status: DC

## 2015-12-10 NOTE — Telephone Encounter (Signed)
-----   Message from Sueanne Margarita, MD sent at 12/08/2015  2:14 PM EDT ----- Echo showed moderate to severely reduced LVF with EF 30-35% with mild MR and mildly dilated LA.  There is septal and apical AK and distal anterior and inferior HK.  Add aldactone 25mg  daily and followup with me in 2-3 weeks

## 2015-12-10 NOTE — Telephone Encounter (Signed)
Informed patient of results and verbal understanding expressed.  Instructed patient to START ALDACTONE 25 mg daily. Scheduled patient with Dr. Radford Pax 11/3. Patient agrees with treatment plan.

## 2015-12-30 ENCOUNTER — Encounter: Payer: Self-pay | Admitting: Physician Assistant

## 2015-12-30 NOTE — Progress Notes (Signed)
Cardiology Office Note    Date:  12/31/2015  ID:  Terri [name], DOB Apr 25, 1960, MRN [id number] PCP:  Vena Austria, MD  Cardiologist:  Radford Pax   Chief Complaint: f/u CHF  History of Present Illness:  Terri [name] is a 55 y.o. female with history of NICM presumed idiopathic, chronic systolic CHF, HTN, obesity, hyperlipidemia, LBBB who presents for follow-up. PMH notes indicate prior cath 2010 with normal coronary arteries (report unavailable). Nuc 05/2013: low risk with apical thinning defect, no reversible ischemia, dilated LV, EF 43% with mod global HK. Holter 10/2012 NSR. She was seen by Dr. Radford Pax 11/26/15 at which time weight was up 10 lbs, not clearly related to fluid. Dr. Radford Pax obtained an echo 12/08/15 showed decline in LVEF (prev 40-45%) - report showing septal and apical akinesis, distal anterior wall and inferior wall hypokinesis, EF 99991111, normal diastolic parameters, mild MR, mildly dilated LA. Dr. Radford Pax recommended to add aldactone and f/u as planned. BMET 11/26/15 with K 4.4, Cr 0.81; last CBC 2013 wnl.  She presents back for follow-up to review echo results. She denies any chest pain or SOB. She does report chronic LEE - states her legs are very skinny so people don't typically notice it. It is not apparently by exam today. She denies any syncope. She has occasional short-lived fluttering of her heart when she lies down at night. Her pharmacist informed her of a potential interaction between spironolactone and potassium and asked her to check with [place], so she hasn't been taking her potassium. She reports feeling "tired all the time" - chronic for the last 3 years. She had a sleep study 04/2014 which did not show any significant OSA.    Past Medical History:  Diagnosis Date  . Chronic systolic congestive heart failure ([place])   . Hypercholesteremia   . Hypertension   . LBBB (left bundle branch block)   . Nonischemic cardiomyopathy ([place])    a. LVEF 40-45%, presumed  idiopathic by echo 05/2012. b. Decline in EF to 30-35% in 2017.  . Obesity     Past Surgical History:  Procedure Laterality Date  . CARDIAC CATHETERIZATION  2010   normal coronary arteries  . FEMORAL ARTERY STENT  06/2010   placement of stent in femoral artery for chronic LE edema    Current Medications: Current Outpatient Prescriptions  Medication Sig Dispense Refill  . amLODipine (NORVASC) 5 MG tablet Take 1 tablet (5 mg total) by mouth daily. 90 tablet 3  . carvedilol (COREG) 25 MG tablet Take 1 tablet (25 mg total) by mouth 2 (two) times daily with a meal. 180 tablet 3  . furosemide (LASIX) 20 MG tablet Take 1 tablet (20 mg total) by mouth daily. 90 tablet 3  . ramipril (ALTACE) 10 MG capsule Take 1 capsule (10 mg total) by mouth daily. 90 capsule 3  . spironolactone (ALDACTONE) 25 MG tablet Take 1 tablet (25 mg total) by mouth daily. 30 tablet 11   No current facility-administered medications for this visit.      Allergies:   Review of patient's allergies indicates no known allergies.   Social History   Social History  . Marital status: Widowed    Spouse name: N/A  . Number of children: N/A  . Years of education: N/A   Social History Main Topics  . Smoking status: Never Smoker  . Smokeless tobacco: Never Used  . Alcohol use No  . Drug use: No  . Sexual activity: Not Asked   Other  Topics Concern  . None   Social History Narrative  . None     Family History:  The patient's family history includes CVA in her mother; Hypertension in her brother, brother, father, mother, and sister.   ROS:   Please see the history of present illness. All other systems are reviewed and otherwise negative.    PHYSICAL EXAM:   VS:  BP 126/86   Pulse 65   Ht 5\' 2"  (1.575 m)   Wt 178 lb (80.7 kg)   BMI 32.56 kg/m   BMI: Body mass index is 32.56 kg/m. GEN: Well nourished, well developed obese AAF, in no acute distress  HEENT: normocephalic, atraumatic Neck: no JVD, carotid  bruits, or masses Cardiac: RRR; no murmurs, rubs, or gallops, no edema  Respiratory:  clear to auscultation bilaterally, normal work of breathing GI: soft, nontender, nondistended, + BS MS: no deformity or atrophy  Skin: warm and dry, no rash Neuro:  Alert and Oriented x 3, Strength and sensation are intact, follows commands Psych: euthymic mood, full affect  Wt Readings from Last 3 Encounters:  12/31/15 178 lb (80.7 kg)  11/26/15 181 lb 1.9 oz (82.2 kg)  05/13/15 179 lb 6.4 oz (81.4 kg)      Studies/Labs Reviewed:   EKG:  EKG was ordered today and personally reviewed by me and demonstrates NSR 65bpm LAD, LBBB  Recent Labs: 11/26/2015: BUN 8; Creat 0.81; Potassium 4.4; Sodium 140   Lipid Panel No results found for: CHOL, TRIG, HDL, CHOLHDL, VLDL, LDLCALC, LDLDIRECT  Additional studies/ records that were reviewed today include: Summarized above.    ASSESSMENT & PLAN:   1. NICM/Chronic systolic CHF - EF decreased compared to prior. Will f/u BMET today on Aldactone to determine if she needs to add back her prior KCl. May be a candidate to change ramipril to [facility]. We can d/c amlodipine if needed to further advance HF regimen. I will then plan to review labs and echo with Dr. Radford Pax. Will also review with Dr. Radford Pax whether an updated ischemic evaluation would be helpful. Ultimately will need f/u echo 3 months after optimization of med regimen. If LVEF remains down at that time would consider referral to EP for CRT-D. Reviewed importance of daily weights, low sodium diet, and 2L fluid restriction with patient today. 2. Essential HTN - monitor as we adjust meds. 3. Fatigue - chronic for 3 years. Will add thyroid function and CBC to labs today. 4. Obesity class I - long-term weight loss would be helpful with increasing physical activity.   Disposition: F/u TBD based on my discussion with Dr. Radford Pax.   Medication Adjustments/Labs and Tests Ordered: Current medicines are reviewed  at length with the patient today.  Concerns regarding medicines are outlined above. Medication changes, Labs and Tests ordered today are summarized above and listed in the Patient Instructions accessible in Encounters.   Raechel Ache PA-C  12/31/2015 9:57 AM    [facility], [place], [place]  [address] Phone: [phone]; Fax: [phone]

## 2015-12-31 ENCOUNTER — Ambulatory Visit: Payer: 59 | Admitting: Cardiology

## 2015-12-31 ENCOUNTER — Ambulatory Visit (INDEPENDENT_AMBULATORY_CARE_PROVIDER_SITE_OTHER): Payer: 59 | Admitting: Physician Assistant

## 2015-12-31 ENCOUNTER — Encounter: Payer: Self-pay | Admitting: Physician Assistant

## 2015-12-31 ENCOUNTER — Encounter (INDEPENDENT_AMBULATORY_CARE_PROVIDER_SITE_OTHER): Payer: Self-pay

## 2015-12-31 VITALS — BP 126/86 | HR 65 | Ht 62.0 in | Wt 178.0 lb

## 2015-12-31 DIAGNOSIS — I5022 Chronic systolic (congestive) heart failure: Secondary | ICD-10-CM

## 2015-12-31 DIAGNOSIS — I428 Other cardiomyopathies: Secondary | ICD-10-CM | POA: Diagnosis not present

## 2015-12-31 DIAGNOSIS — I10 Essential (primary) hypertension: Secondary | ICD-10-CM | POA: Diagnosis not present

## 2015-12-31 DIAGNOSIS — E669 Obesity, unspecified: Secondary | ICD-10-CM

## 2015-12-31 DIAGNOSIS — R5383 Other fatigue: Secondary | ICD-10-CM

## 2015-12-31 LAB — CBC
HEMATOCRIT: 36 % (ref 35.0–45.0)
Hemoglobin: 12.1 g/dL (ref 11.7–15.5)
MCH: 26 pg — AB (ref 27.0–33.0)
MCHC: 33.6 g/dL (ref 32.0–36.0)
MCV: 77.3 fL — AB (ref 80.0–100.0)
MPV: 10.3 fL (ref 7.5–12.5)
Platelets: 203 10*3/uL (ref 140–400)
RBC: 4.66 MIL/uL (ref 3.80–5.10)
RDW: 14.9 % (ref 11.0–15.0)
WBC: 5.7 10*3/uL (ref 3.8–10.8)

## 2015-12-31 LAB — BASIC METABOLIC PANEL
BUN: 11 mg/dL (ref 7–25)
CO2: 19 mmol/L — AB (ref 20–31)
Calcium: 9.4 mg/dL (ref 8.6–10.4)
Chloride: 107 mmol/L (ref 98–110)
Creat: 0.95 mg/dL (ref 0.50–1.05)
GLUCOSE: 97 mg/dL (ref 65–99)
POTASSIUM: 4.3 mmol/L (ref 3.5–5.3)
Sodium: 140 mmol/L (ref 135–146)

## 2015-12-31 LAB — T4, FREE: Free T4: 1.2 ng/dL (ref 0.8–1.8)

## 2015-12-31 LAB — TSH: TSH: 2.18 mIU/L

## 2015-12-31 NOTE — Patient Instructions (Addendum)
Medication Instructions:  Your physician recommends that you continue on your current medications as directed. Please refer to the Current Medication list given to you today.  Labwork: TODAY:  BMET, CBC, TSH, & FREE T4  Testing/Procedures: None ordered  Follow-Up: Your physician recommends that you schedule a follow-up appointment in: WILL BE DETERMINED.  WE WILL CALL YOU.   Any Other Special Instructions Will Be Listed Below (If Applicable).     If you need a refill on your cardiac medications before your next appointment, please call your pharmacy.

## 2016-01-05 ENCOUNTER — Telehealth: Payer: Self-pay | Admitting: Physician Assistant

## 2016-01-05 ENCOUNTER — Telehealth: Payer: Self-pay | Admitting: Cardiology

## 2016-01-05 DIAGNOSIS — I255 Ischemic cardiomyopathy: Secondary | ICD-10-CM

## 2016-01-05 MED ORDER — SACUBITRIL-VALSARTAN 24-26 MG PO TABS: 1.0000 | ORAL_TABLET | Freq: Two times a day (BID) | ORAL | 0 refills | Status: DC

## 2016-01-05 NOTE — Telephone Encounter (Signed)
Pt has been made aware of her lab results. She has been advised to: stop ramipril  start Entresto 24/26mg  BID - first dose needs to be at least 36 HOURS after her LAST dose of ramipril She is aware that we are ordering a Coronary CT to evaluate heart blockages and she will be scheduled for 2 week f/u Pt is agreeable with this plan and all changes and orders have been placed in EPIC

## 2016-01-05 NOTE — Telephone Encounter (Signed)
Called Doren Custard at [facility] and informed him that the pt is aware to stop Ramipril on 01/05/16 and start Entresto 24/26 mg BID, start first dose of Entresto at least 36 hours after last dose of Ramipril. Pharmacist is aware that Ramipril was D/C. Pharmacist verbalized understanding.

## 2016-01-05 NOTE — Telephone Encounter (Signed)
New message  Pt is returning call to jennifer witty  Please call back

## 2016-01-05 NOTE — Telephone Encounter (Signed)
Phillip([facility]) is calling to find out about Rampril 10mg  and the [facility] and is needing a override , They interact w/ each other .Marland Kitchen Please call

## 2016-01-05 NOTE — Telephone Encounter (Signed)
Ramipril has been dc'd.  I think you were handling this earlier.

## 2016-01-05 NOTE — Telephone Encounter (Signed)
Returned pts call.  Left another message for her to call back.

## 2016-01-05 NOTE — Telephone Encounter (Signed)
-----   Message from Charlie Pitter, [place] sent at 01/04/2016  4:17 PM EST ----- CORRECTION: per further discussion with Dr. Radford Pax, please arrange coronary CT to evaluate for heart blockages INSTEAD of Lexiscan nuclear stress test. The other recommendations remain the same. Dayna Dunn PA-C

## 2016-01-14 ENCOUNTER — Encounter: Payer: Self-pay | Admitting: Cardiology

## 2016-02-03 ENCOUNTER — Other Ambulatory Visit (HOSPITAL_COMMUNITY): Payer: Self-pay | Admitting: Cardiology

## 2016-02-03 ENCOUNTER — Ambulatory Visit (HOSPITAL_COMMUNITY)
Admission: RE | Admit: 2016-02-03 | Discharge: 2016-02-03 | Disposition: A | Discharge status: Home / Self Care | Payer: 59 | Source: Ambulatory Visit | Attending: Cardiology | Admitting: Cardiology

## 2016-02-03 ENCOUNTER — Encounter (HOSPITAL_COMMUNITY): Payer: Self-pay | Admitting: Interventional Radiology

## 2016-02-03 ENCOUNTER — Ambulatory Visit (HOSPITAL_COMMUNITY)
Admission: RE | Admit: 2016-02-03 | Discharge: 2016-02-03 | Disposition: A | Discharge status: Home / Self Care | Payer: 59 | Source: Ambulatory Visit | Attending: Physician Assistant | Admitting: Physician Assistant

## 2016-02-03 DIAGNOSIS — I255 Ischemic cardiomyopathy: Secondary | ICD-10-CM | POA: Insufficient documentation

## 2016-02-03 DIAGNOSIS — I429 Cardiomyopathy, unspecified: Secondary | ICD-10-CM

## 2016-02-03 DIAGNOSIS — R079 Chest pain, unspecified: Secondary | ICD-10-CM | POA: Insufficient documentation

## 2016-02-03 DIAGNOSIS — I10 Essential (primary) hypertension: Secondary | ICD-10-CM | POA: Diagnosis not present

## 2016-02-03 HISTORY — PX: IR GENERIC HISTORICAL: IMG1180011

## 2016-02-03 MED ORDER — NITROGLYCERIN 0.4 MG SL SUBL
0.8000 mg | SUBLINGUAL_TABLET | Freq: Once | SUBLINGUAL | Status: AC
  Administered 2016-02-03: 0.8 mg via SUBLINGUAL

## 2016-02-03 MED ORDER — LIDOCAINE HCL 1 % IJ SOLN
INTRAMUSCULAR | Status: AC
  Filled 2016-02-03: qty 20

## 2016-02-03 MED ORDER — NITROGLYCERIN 0.4 MG SL SUBL
SUBLINGUAL_TABLET | SUBLINGUAL | Status: AC
  Filled 2016-02-03: qty 2

## 2016-02-03 MED ORDER — IOPAMIDOL (ISOVUE-370) INJECTION 76%
INTRAVENOUS | Status: AC
  Administered 2016-02-03: 100 mL
  Filled 2016-02-03: qty 100

## 2016-02-03 MED ORDER — LIDOCAINE HCL 1 % IJ SOLN
INTRAMUSCULAR | Status: DC | PRN
  Administered 2016-02-03: 5 mL

## 2016-02-03 NOTE — Procedures (Signed)
Interventional Radiology Procedure Note  Procedure: Placement of a right upper arm IV, US guided.   Complications: None Recommendations:  - Ok to use  Signed,  Dulcy Fanny. Earleen Newport, DO

## 2016-02-08 ENCOUNTER — Encounter: Payer: Self-pay | Admitting: Physician Assistant

## 2016-02-08 DIAGNOSIS — E7800 Pure hypercholesterolemia, unspecified: Secondary | ICD-10-CM | POA: Insufficient documentation

## 2016-02-08 NOTE — Progress Notes (Signed)
Cardiology Office Note    Date:  02/09/2016  ID:  [name], DOB 08-13-1960, MRN [id number] PCP:  Vena Austria, MD  Cardiologist:  Dr. Radford Pax   Chief Complaint: follow up heart failure  History of Present Illness:  Terri [name] is a 55 y.o. female with history of NICM (presumed idiopathic - normal cors 2010, neg cardiac CT [id number]), chronic systolic CHF, HTN, obesity, hyperlipidemia, LBBB who presents for follow-up. PMH notes indicate prior cath 2010 with normal coronary arteries (report unavailable). Nuc 05/2013: low risk with apical thinning defect, no reversible ischemia, dilated LV, EF 43% with mod global HK. Holter 10/2012 NSR. sleep study 04/2014 did not show any significant OSA. She was seen by Dr. Radford Pax 11/26/15 at which time weight was up 10 lbs, not clearly related to fluid. Dr. Radford Pax obtained an echo 12/08/15 showed decline in LVEF report showing septal and apical akinesis, distal anterior wall and inferior wall hypokinesis, EF 30-35% (prev [id number]), normal diastolic parameters, mild MR, mildly dilated LA. Since that visit, we have been working on medication titration. Spironolactone was added in 11/2015 and we switched ramipril to Entresto at [facility] 12/2015. Labs 12/31/15: K 4.3, Cr 0.95, Hgb 12.1, normal thyroid. Coronary CTA 02/03/16 showed calcium score of 0, no significant CAD.  She presents back for follow-up overall doing well from cardiac standpoint. No CP, SOB, LEE. Weight generally stable the last few months. She seems to be tolerating the Entresto without problem. She is somewhat concerned about the cost. She has been using samples but her pharmacy told her this would cost $50/month which she feels is steep. She does plan to see her PCP about intermittent headaches that have been present for several months, prior to med initiation.   Past Medical History:  Diagnosis Date  . Chronic systolic congestive heart failure ([place])   . Hypercholesteremia   . Hypertension     . LBBB (left bundle branch block)   . Nonischemic cardiomyopathy ([place])    a. LVEF 40-45%, presumed idiopathic by echo 05/2012. b. Decline in EF to 30-35% in 2017 - neg cardiac CT for CAD at that time 01/2016.  . Obesity     Past Surgical History:  Procedure Laterality Date  . CARDIAC CATHETERIZATION  2010   normal coronary arteries  . FEMORAL ARTERY STENT  06/2010   placement of stent in femoral artery for chronic LE edema  . IR GENERIC HISTORICAL  02/03/2016   IR US GUIDE VASC ACCESS RIGHT 02/03/2016 Corrie Mckusick, DO [facility]  . IR GENERIC HISTORICAL  02/03/2016   IR RADIOLOGY PERIPHERAL GUIDED IV START 02/03/2016 Corrie Mckusick, DO [facility]    Current Medications: Current Outpatient Prescriptions  Medication Sig Dispense Refill  . amLODipine (NORVASC) 5 MG tablet Take 1 tablet (5 mg total) by mouth daily. 90 tablet 3  . carvedilol (COREG) 25 MG tablet Take 1 tablet (25 mg total) by mouth 2 (two) times daily with a meal. 180 tablet 3  . furosemide (LASIX) 20 MG tablet Take 1 tablet (20 mg total) by mouth daily. 90 tablet 3  . sacubitril-valsartan (ENTRESTO) 24-26 MG Take 1 tablet by mouth 2 (two) times daily. 28 tablet 0  . spironolactone (ALDACTONE) 25 MG tablet Take 1 tablet (25 mg total) by mouth daily. 30 tablet 11   No current facility-administered medications for this visit.      Allergies:   Patient has no known allergies.   Social History   Social History  . Marital  status: Widowed    Spouse name: N/A  . Number of children: N/A  . Years of education: N/A   Social History Main Topics  . Smoking status: Never Smoker  . Smokeless tobacco: Never Used  . Alcohol use No  . Drug use: No  . Sexual activity: Not Asked   Other Topics Concern  . None   Social History Narrative  . None     Family History:  The patient's family history includes CVA in her mother; Hypertension in her brother, brother, father, mother, and sister.   ROS:   Please see the history  of present illness.  All other systems are reviewed and otherwise negative.    PHYSICAL EXAM:   VS:  BP 118/70   Pulse 68   Ht 5\' 2"  (1.575 m)   Wt 180 lb (81.6 kg)   SpO2 98%   BMI 32.92 kg/m   BMI: Body mass index is 32.92 kg/m. GEN: Well nourished, well developed overweight AAF, in no acute distress  HEENT: normocephalic, atraumatic Neck: no JVD, carotid bruits, or masses Cardiac: RRR; no murmurs, rubs, or gallops, no edema  Respiratory:  clear to auscultation bilaterally, normal work of breathing GI: soft, nontender, nondistended, + BS MS: no deformity or atrophy  Skin: warm and dry, no rash Neuro:  Alert and Oriented x 3, Strength and sensation are intact, follows commands Psych: euthymic mood, full affect  Wt Readings from Last 3 Encounters:  02/09/16 180 lb (81.6 kg)  12/31/15 178 lb (80.7 kg)  11/26/15 181 lb 1.9 oz (82.2 kg)      Studies/Labs Reviewed:   EKG:  EKG was not ordered today.  Recent Labs: 12/31/2015: BUN 11; Creat 0.95; Hemoglobin 12.1; Platelets 203; Potassium 4.3; Sodium 140; TSH 2.18   Lipid Panel No results found for: CHOL, TRIG, HDL, CHOLHDL, VLDL, LDLCALC, LDLDIRECT  Additional studies/ records that were reviewed today include: Summarized above.    ASSESSMENT & PLAN:   1. NICM/Chronic systolic CHF - no signs of HF on exam. Will continue to titrate medications. Per review with Dr. Radford Pax, will stop amlodipine and increase Entresto to 49/51mg  BID. Samples given today in clinic. Will also ask our patient assistance coordinator help assist with what options are available for Entresto affordability. Check BMET today. If BP permits, at next visit, would further titrate Entresto to 97/103mg . Once meds are optimized, will need repeat echocardiogram.  2. LBBB - ultimately if her EF remains low, may need to consider CRT-D. 3. Essential HTN - follow with med changes. 4. Hyperlipidemia - further monitoring by primary care 5. Obesity -- long-term  weight loss would be helpful with increasing physical activity. We reviewed this again today.  Disposition: F/u with Dr. Radford Pax or APP on our care team in 4 weeks.    Medication Adjustments/Labs and Tests Ordered: Current medicines are reviewed at length with the patient today.  Concerns regarding medicines are outlined above. Medication changes, Labs and Tests ordered today are summarized above and listed in the Patient Instructions accessible in Encounters.   Raechel Ache PA-C  02/09/2016 11:44 AM    [facility], [place], [place]  [address] Phone: [phone]; Fax: [phone]

## 2016-02-09 ENCOUNTER — Encounter: Payer: Self-pay | Admitting: Physician Assistant

## 2016-02-09 ENCOUNTER — Ambulatory Visit (INDEPENDENT_AMBULATORY_CARE_PROVIDER_SITE_OTHER): Payer: 59 | Admitting: Physician Assistant

## 2016-02-09 VITALS — BP 118/70 | HR 68 | Ht 62.0 in | Wt 180.0 lb

## 2016-02-09 DIAGNOSIS — I447 Left bundle-branch block, unspecified: Secondary | ICD-10-CM

## 2016-02-09 DIAGNOSIS — I5022 Chronic systolic (congestive) heart failure: Secondary | ICD-10-CM

## 2016-02-09 DIAGNOSIS — I428 Other cardiomyopathies: Secondary | ICD-10-CM

## 2016-02-09 DIAGNOSIS — I10 Essential (primary) hypertension: Secondary | ICD-10-CM

## 2016-02-09 DIAGNOSIS — E7800 Pure hypercholesterolemia, unspecified: Secondary | ICD-10-CM

## 2016-02-09 LAB — BASIC METABOLIC PANEL
BUN: 12 mg/dL (ref 7–25)
CHLORIDE: 104 mmol/L (ref 98–110)
CO2: 26 mmol/L (ref 20–31)
Calcium: 9.3 mg/dL (ref 8.6–10.4)
Creat: 0.96 mg/dL (ref 0.50–1.05)
GLUCOSE: 86 mg/dL (ref 65–99)
POTASSIUM: 4.6 mmol/L (ref 3.5–5.3)
SODIUM: 138 mmol/L (ref 135–146)

## 2016-02-09 NOTE — Patient Instructions (Signed)
Your physician has recommended you make the following change in your medication:  INCREASE  ENTRESTO TO  51/49  MG TWICE  A  DAY  STOP AMLODIPINE Your physician recommends that you return for lab work in:  [facility] physician recommends that you schedule a follow-up appointment in:  [place]

## 2016-02-09 NOTE — Addendum Note (Signed)
Addended by: Eulis Foster on: 02/09/2016 12:18 PM   Modules accepted: Orders

## 2016-02-09 NOTE — Addendum Note (Signed)
Addended by: Eulis Foster on: 02/09/2016 12:19 PM   Modules accepted: Orders

## 2016-02-16 ENCOUNTER — Telehealth: Payer: Self-pay | Admitting: Physician Assistant

## 2016-02-16 NOTE — Telephone Encounter (Signed)
Returned pts call. We discussed her lab results. She verbalized understanding.

## 2016-02-16 NOTE — Telephone Encounter (Signed)
New message ° ° ° ° °Returning a call to the nurse °

## 2016-03-03 ENCOUNTER — Telehealth: Payer: Self-pay | Admitting: Cardiology

## 2016-03-03 NOTE — Telephone Encounter (Signed)
Terri [name] is calling in reference to her Terri [name] , she was told that when she ran out of the [place] that you could give her two more . Please call . Thanks

## 2016-03-03 NOTE — Telephone Encounter (Signed)
Should she now be taking the 49-51 mg? Her med list still has 24-26 mg listed but per last office visit increase entresto to 49-51 mg. Please advise. Thanks, MI

## 2016-03-04 NOTE — Telephone Encounter (Signed)
49/51mg  is the correct dose.

## 2016-03-06 ENCOUNTER — Telehealth: Payer: Self-pay

## 2016-03-06 ENCOUNTER — Other Ambulatory Visit: Payer: Self-pay

## 2016-03-06 MED ORDER — SACUBITRIL-VALSARTAN 49-51 MG PO TABS: 1.0000 | ORAL_TABLET | Freq: Two times a day (BID) | ORAL | 10 refills | Status: DC

## 2016-03-06 NOTE — Telephone Encounter (Signed)
Samples placed at the front desk. Called patient but did not get an answer.

## 2016-03-06 NOTE — Telephone Encounter (Signed)
Patient picked up 1 bottle of Entresto 49-51 today. I gave her a coupon for 30 day free trial offer. Also told her we would need to do a PA for this, but then she could get a monthly supply for $10.

## 2016-03-14 DIAGNOSIS — E669 Obesity, unspecified: Secondary | ICD-10-CM | POA: Insufficient documentation

## 2016-03-14 NOTE — Progress Notes (Deleted)
Cardiology Office Note    Date:  03/14/2016  ID:  Terri [name], DOB 08-Jul-1960, MRN [id number] PCP:  Vena Austria, MD  Cardiologist:  Radford Pax   Chief Complaint: f/u med titration  History of Present Illness:  Terri [name] is a 56 y.o. female with history of NICM (presumed idiopathic - normal cors 2010, neg cardiac CT [id number]), chronic systolic CHF, HTN, obesity, hyperlipidemia, LBBB who presents for follow-up. PMH notes indicate prior cath 2010 with normal coronary arteries (report unavailable). Nuc 05/2013: low risk with apical thinning defect, no reversible ischemia, dilated LV, EF 43% with mod global HK. Holter 10/2012 NSR. sleep study 04/2014 did not show any significant OSA. She was seen by Dr. Radford Pax 11/26/15 at which time weight was up 10 lbs, not clearly related to fluid. Dr. Radford Pax obtained an echo 12/08/15 showed decline in LVEF report showing septal and apical akinesis, distal anterior wall and inferior wall hypokinesis, EF 30-35% (prev [id number]), normal diastolic parameters, mild MR, mildly dilated LA. Since that visit, we have been working on medication titration. Spironolactone was added in 11/2015 and we switched ramipril to Entresto at OV 12/2015. Recent labs: K 4.6, Cr 0.96, Hgb 12.1, normal thyroid. Coronary CTA 02/03/16 showed calcium score of 0, no significant CAD. She has been followed closely the last few months for med optimization, particularly with Entresto.    Past Medical History:  Diagnosis Date  . Chronic systolic congestive heart failure ([place])   . Hypercholesteremia   . Hypertension   . LBBB (left bundle branch block)   . Nonischemic cardiomyopathy ([place])    a. LVEF 40-45%, presumed idiopathic by echo 05/2012. b. Decline in EF to 30-35% in 2017 - neg cardiac CT for CAD at that time 01/2016.  . Obesity     Past Surgical History:  Procedure Laterality Date  . CARDIAC CATHETERIZATION  2010   normal coronary arteries  . FEMORAL ARTERY STENT  06/2010   placement of stent in femoral artery for chronic LE edema  . IR GENERIC HISTORICAL  02/03/2016   IR US GUIDE VASC ACCESS RIGHT 02/03/2016 Corrie Mckusick, DO [facility]  . IR GENERIC HISTORICAL  02/03/2016   IR RADIOLOGY PERIPHERAL GUIDED IV START 02/03/2016 Corrie Mckusick, DO [facility]    Current Medications: Current Outpatient Prescriptions  Medication Sig Dispense Refill  . amLODipine (NORVASC) 5 MG tablet Take 1 tablet (5 mg total) by mouth daily. 90 tablet 3  . carvedilol (COREG) 25 MG tablet Take 1 tablet (25 mg total) by mouth 2 (two) times daily with a meal. 180 tablet 3  . furosemide (LASIX) 20 MG tablet Take 1 tablet (20 mg total) by mouth daily. 90 tablet 3  . sacubitril-valsartan (ENTRESTO) 49-51 MG Take 1 tablet by mouth 2 (two) times daily. 60 tablet 10  . spironolactone (ALDACTONE) 25 MG tablet Take 1 tablet (25 mg total) by mouth daily. 30 tablet 11   No current facility-administered medications for this visit.      Allergies:   Patient has no known allergies.   Social History   Social History  . Marital status: Widowed    Spouse name: N/A  . Number of children: N/A  . Years of education: N/A   Social History Main Topics  . Smoking status: Never Smoker  . Smokeless tobacco: Never Used  . Alcohol use No  . Drug use: No  . Sexual activity: Not on file   Other Topics Concern  . Not on file  Social History Narrative  . No narrative on file     Family History:  The patient's family history includes CVA in her mother; Hypertension in her brother, brother, father, mother, and sister. ***  ROS:   Please see the history of present illness. Otherwise, review of systems is positive for ***.  All other systems are reviewed and otherwise negative.    PHYSICAL EXAM:   VS:  There were no vitals taken for this visit.  BMI: There is no height or weight on file to calculate BMI. GEN: Well nourished, well developed, in no acute distress  HEENT: normocephalic,  atraumatic Neck: no JVD, carotid bruits, or masses Cardiac: ***RRR; no murmurs, rubs, or gallops, no edema  Respiratory:  clear to auscultation bilaterally, normal work of breathing GI: soft, nontender, nondistended, + BS MS: no deformity or atrophy  Skin: warm and dry, no rash Neuro:  Alert and Oriented x 3, Strength and sensation are intact, follows commands Psych: euthymic mood, full affect  Wt Readings from Last 3 Encounters:  02/09/16 180 lb (81.6 kg)  12/31/15 178 lb (80.7 kg)  11/26/15 181 lb 1.9 oz (82.2 kg)      Studies/Labs Reviewed:   EKG:  EKG was ordered today and personally reviewed by me and demonstrates *** EKG was not ordered today.***  Recent Labs: 12/31/2015: Hemoglobin 12.1; Platelets 203; TSH 2.18 02/09/2016: BUN 12; Creat 0.96; Potassium 4.6; Sodium 138   Lipid Panel No results found for: CHOL, TRIG, HDL, CHOLHDL, VLDL, LDLCALC, LDLDIRECT  Additional studies/ records that were reviewed today include: Summarized above.***    ASSESSMENT & PLAN:   1. NICM/chronic systolic cHF 2. LBBB 3. Essential HTN 4. Hyperlipidemia 5. Obesity  Disposition: F/u with ***   Medication Adjustments/Labs and Tests Ordered: Current medicines are reviewed at length with the patient today.  Concerns regarding medicines are outlined above. Medication changes, Labs and Tests ordered today are summarized above and listed in the Patient Instructions accessible in Encounters.   Raechel Ache PA-C  03/14/2016 7:36 AM    [facility], [place], [place]  [address] Phone: [phone]; Fax: [phone]

## 2016-03-15 ENCOUNTER — Ambulatory Visit: Payer: 59 | Admitting: Physician Assistant

## 2016-03-23 NOTE — Progress Notes (Signed)
Cardiology Office Note    Date:  03/24/2016   ID:  Terri [name], DOB 02-16-1961, MRN [id number]  PCP:  Vena Austria, MD  Cardiologist:  Fransico Him, MD   Chief Complaint  Patient presents with  . Cardiomyopathy  . Congestive Heart Failure  . Hypertension    History of Present Illness:  Terri [name] is a 56 y.o. female with history of NICM (presumed idiopathic - normal cors 2010, neg cardiac CT [id number]), chronic systolic CHF, HTN, obesity, hyperlipidemia, LBBB who presents for follow-up. PMH notes indicate prior cath 2010 with normal coronary arteries (report unavailable). Nuc 05/2013: low risk with apical thinning defect, no reversible ischemia, dilated LV, EF 43% with mod global HK. Holter 10/2012 NSR. sleep study 04/2014 did not show any significant OSA. She was seen by me 11/26/15 at which time weight was up 10 lbs, not clearly related to fluid. 2D echo 12/08/15 showed decline in LVEF with septal and apical akinesis, distal anterior wall and inferior wall hypokinesis, EF 30-35% (prev [id number]), normal diastolic parameters, mild MR, mildly dilated LA. Since that visit, we have been working on medication titration. Spironolactone was added in 11/2015 and we switched ramipril to Entresto at [facility] 12/2015 and then increased to 49/51mg  BID at [facility] 02/09/2016.  Labs 01/1316: K 4.6, Cr 0.96. Coronary CTA 02/03/16 showed calcium score of 0, no significant CAD.  She presents back for follow-up overall doing well from cardiac standpoint. She has not had any CP, SOB, PND, orthopnea, dizziness, palpitations or syncope.  She has minimal swelling that is controlled with compression hose. Weight has increased some that she blames on the recent holidays. She seems to be tolerating the Entresto but does occasionally has some dizziness.   Past Medical History:  Diagnosis Date  . Chronic systolic congestive heart failure ([place])   . Hypercholesteremia   . Hypertension   . LBBB (left bundle branch  block)   . Nonischemic cardiomyopathy ([place])    a. LVEF 40-45%, presumed idiopathic by echo 05/2012. b. Decline in EF to 30-35% in 2017 - neg cardiac CT for CAD at that time 01/2016.  . Obesity     Past Surgical History:  Procedure Laterality Date  . CARDIAC CATHETERIZATION  2010   normal coronary arteries  . FEMORAL ARTERY STENT  06/2010   placement of stent in femoral artery for chronic LE edema  . IR GENERIC HISTORICAL  02/03/2016   IR US GUIDE VASC ACCESS RIGHT 02/03/2016 Corrie Mckusick, DO [facility]  . IR GENERIC HISTORICAL  02/03/2016   IR RADIOLOGY PERIPHERAL GUIDED IV START 02/03/2016 Corrie Mckusick, DO [facility]    Current Medications: Outpatient Medications Prior to Visit  Medication Sig Dispense Refill  . carvedilol (COREG) 25 MG tablet Take 1 tablet (25 mg total) by mouth 2 (two) times daily with a meal. 180 tablet 3  . furosemide (LASIX) 20 MG tablet Take 1 tablet (20 mg total) by mouth daily. 90 tablet 3  . sacubitril-valsartan (ENTRESTO) 49-51 MG Take 1 tablet by mouth 2 (two) times daily. 60 tablet 10  . spironolactone (ALDACTONE) 25 MG tablet Take 1 tablet (25 mg total) by mouth daily. 30 tablet 11  . amLODipine (NORVASC) 5 MG tablet Take 1 tablet (5 mg total) by mouth daily. 90 tablet 3   No facility-administered medications prior to visit.      Allergies:   Patient has no known allergies.   Social History   Social History  . Marital status: Widowed  Spouse name: N/A  . Number of children: N/A  . Years of education: N/A   Social History Main Topics  . Smoking status: Never Smoker  . Smokeless tobacco: Never Used  . Alcohol use No  . Drug use: No  . Sexual activity: Not on file   Other Topics Concern  . Not on file   Social History Narrative  . No narrative on file     Family History:  The patient's family history includes CVA in her mother; Hypertension in her brother, brother, father, mother, and sister.   ROS:   Please see the history of  present illness.    ROS All other systems reviewed and are negative.  No flowsheet data found.     PHYSICAL EXAM:   VS:  BP 110/72   Pulse 71   Ht 5\' 2"  (1.575 m)   Wt 184 lb (83.5 kg)   SpO2 97% Comment: at rest  BMI 33.65 kg/m    GEN: Well nourished, well developed, in no acute distress  HEENT: normal  Neck: no JVD, carotid bruits, or masses Cardiac: RRR; no murmurs, rubs, or gallops,no edema.  Intact distal pulses bilaterally.  Respiratory:  clear to auscultation bilaterally, normal work of breathing GI: soft, nontender, nondistended, + BS MS: no deformity or atrophy  Skin: warm and dry, no rash Neuro:  Alert and Oriented x 3, Strength and sensation are intact Psych: euthymic mood, full affect  Wt Readings from Last 3 Encounters:  03/24/16 184 lb (83.5 kg)  02/09/16 180 lb (81.6 kg)  12/31/15 178 lb (80.7 kg)      Studies/Labs Reviewed:   EKG:  EKG is not ordered today.    Recent Labs: 12/31/2015: Hemoglobin 12.1; Platelets 203; TSH 2.18 02/09/2016: BUN 12; Creat 0.96; Potassium 4.6; Sodium 138   Lipid Panel No results found for: CHOL, TRIG, HDL, CHOLHDL, VLDL, LDLCALC, LDLDIRECT  Additional studies/ records that were reviewed today include:  none    ASSESSMENT:    1. Chronic systolic congestive heart failure, NYHA class 2 ([place])   2. Essential hypertension, benign   3. Nonischemic cardiomyopathy ([place])      PLAN:  In order of problems listed above:  1. Chronic systolic CHF NYHA class 2.  She is doing well and appears euvolemic on exam and weight is stable since last OV.  She will continue on Coreg/aldactone/Entresto and diuretic.  2. HTN - BP controlled on current meds.  She will continue on BB/ARB and aldactone. 3. Nonischemic DCM  EF 30-35% on echo 11/2015.  Repeat echo in 2-3 weeks to see if EF improved on maximum medical therapy.  If EF remains < 35% I will refer her to EP for possible BiV AICD given her underlying LBBB.    Medication  Adjustments/Labs and Tests Ordered: Current medicines are reviewed at length with the patient today.  Concerns regarding medicines are outlined above.  Medication changes, Labs and Tests ordered today are listed in the Patient Instructions below.  There are no Patient Instructions on file for this visit.   Signed, Fransico Him, MD  03/24/2016 8:21 AM    [facility] [address], [place], [place]  [address] Phone: [phone]; Fax: [phone]

## 2016-03-24 ENCOUNTER — Ambulatory Visit (INDEPENDENT_AMBULATORY_CARE_PROVIDER_SITE_OTHER): Payer: 59 | Admitting: Cardiology

## 2016-03-24 ENCOUNTER — Encounter (INDEPENDENT_AMBULATORY_CARE_PROVIDER_SITE_OTHER): Payer: Self-pay

## 2016-03-24 VITALS — BP 110/72 | HR 71 | Ht 62.0 in | Wt 184.0 lb

## 2016-03-24 DIAGNOSIS — I5022 Chronic systolic (congestive) heart failure: Secondary | ICD-10-CM | POA: Diagnosis not present

## 2016-03-24 DIAGNOSIS — I10 Essential (primary) hypertension: Secondary | ICD-10-CM | POA: Diagnosis not present

## 2016-03-24 DIAGNOSIS — I428 Other cardiomyopathies: Secondary | ICD-10-CM | POA: Diagnosis not present

## 2016-03-24 NOTE — Patient Instructions (Signed)
Medication Instructions:  Your physician recommends that you continue on your current medications as directed. Please refer to the Current Medication list given to you today.   Labwork: None  Testing/Procedures: Your physician has requested that you have an echocardiogram. Echocardiography is a painless test that uses sound waves to create images of your heart. It provides your doctor with information about the size and shape of your heart and how well your heart's chambers and valves are working. This procedure takes approximately one hour. There are no restrictions for this procedure.  Follow-Up: Your physician wants you to follow-up in: 6 months with Dr. Turner. You will receive a reminder letter in the mail two months in advance. If you don't receive a letter, please call our office to schedule the follow-up appointment.   Any Other Special Instructions Will Be Listed Below (If Applicable).     If you need a refill on your cardiac medications before your next appointment, please call your pharmacy.   

## 2016-03-31 ENCOUNTER — Other Ambulatory Visit: Payer: Self-pay | Admitting: Obstetrics and Gynecology

## 2016-03-31 DIAGNOSIS — R1902 Left upper quadrant abdominal swelling, mass and lump: Secondary | ICD-10-CM

## 2016-04-06 ENCOUNTER — Other Ambulatory Visit (HOSPITAL_COMMUNITY): Payer: 59

## 2016-04-07 ENCOUNTER — Other Ambulatory Visit: Payer: 59

## 2016-04-10 ENCOUNTER — Other Ambulatory Visit (HOSPITAL_COMMUNITY): Payer: 59

## 2016-04-11 ENCOUNTER — Ambulatory Visit
Admission: RE | Admit: 2016-04-11 | Discharge: 2016-04-11 | Disposition: A | Discharge status: Home / Self Care | Payer: 59 | Source: Ambulatory Visit | Attending: Obstetrics and Gynecology | Admitting: Obstetrics and Gynecology

## 2016-04-11 ENCOUNTER — Other Ambulatory Visit: Payer: Self-pay | Admitting: Obstetrics and Gynecology

## 2016-04-11 DIAGNOSIS — R1902 Left upper quadrant abdominal swelling, mass and lump: Secondary | ICD-10-CM

## 2016-04-11 DIAGNOSIS — R1031 Right lower quadrant pain: Secondary | ICD-10-CM | POA: Diagnosis not present

## 2016-04-24 ENCOUNTER — Other Ambulatory Visit: Payer: Self-pay

## 2016-04-24 ENCOUNTER — Ambulatory Visit (HOSPITAL_COMMUNITY): Payer: 59 | Attending: Cardiovascular Disease

## 2016-04-24 ENCOUNTER — Telehealth: Payer: Self-pay | Admitting: *Deleted

## 2016-04-24 DIAGNOSIS — I428 Other cardiomyopathies: Secondary | ICD-10-CM | POA: Insufficient documentation

## 2016-04-24 DIAGNOSIS — I313 Pericardial effusion (noninflammatory): Secondary | ICD-10-CM | POA: Insufficient documentation

## 2016-04-24 DIAGNOSIS — I340 Nonrheumatic mitral (valve) insufficiency: Secondary | ICD-10-CM | POA: Diagnosis not present

## 2016-04-24 DIAGNOSIS — I447 Left bundle-branch block, unspecified: Secondary | ICD-10-CM | POA: Insufficient documentation

## 2016-04-24 NOTE — Telephone Encounter (Signed)
Lmtcb to go over echo results.  

## 2016-04-25 ENCOUNTER — Telehealth: Payer: Self-pay | Admitting: Cardiology

## 2016-04-25 DIAGNOSIS — I340 Nonrheumatic mitral (valve) insufficiency: Secondary | ICD-10-CM

## 2016-04-25 NOTE — Telephone Encounter (Signed)
New Message     Pt returning [place] Call about echo and test results please call back thank you

## 2016-04-25 NOTE — Telephone Encounter (Signed)
This encounter was created in error - please disregard.

## 2016-04-25 NOTE — Telephone Encounter (Signed)
Informed patient of results and verbal understanding expressed.  Repeat ECHO ordered to be scheduled in 1 year. Patient agrees with treatment plan. 

## 2016-04-25 NOTE — Telephone Encounter (Signed)
-----   Message from Sueanne Margarita, MD sent at 04/24/2016  9:12 AM EST ----- Echo showed low normal LVF with increased stiffness of heart muscle.  Mild to moderate MR - repeat echo in 1 year for MR

## 2016-05-18 DIAGNOSIS — R1902 Left upper quadrant abdominal swelling, mass and lump: Secondary | ICD-10-CM | POA: Diagnosis not present

## 2016-05-18 DIAGNOSIS — I5022 Chronic systolic (congestive) heart failure: Secondary | ICD-10-CM | POA: Diagnosis not present

## 2016-05-24 ENCOUNTER — Other Ambulatory Visit: Payer: Self-pay | Admitting: Family Medicine

## 2016-05-24 DIAGNOSIS — R935 Abnormal findings on diagnostic imaging of other abdominal regions, including retroperitoneum: Secondary | ICD-10-CM

## 2016-06-07 ENCOUNTER — Ambulatory Visit
Admission: RE | Admit: 2016-06-07 | Discharge: 2016-06-07 | Disposition: A | Discharge status: Home / Self Care | Payer: 59 | Source: Ambulatory Visit | Attending: Family Medicine | Admitting: Family Medicine

## 2016-06-07 DIAGNOSIS — R935 Abnormal findings on diagnostic imaging of other abdominal regions, including retroperitoneum: Secondary | ICD-10-CM | POA: Diagnosis not present

## 2016-06-07 MED ORDER — IOPAMIDOL (ISOVUE-300) INJECTION 61%
100.0000 mL | Freq: Once | INTRAVENOUS | Status: AC | PRN
  Administered 2016-06-07: 100 mL via INTRAVENOUS

## 2016-06-17 ENCOUNTER — Other Ambulatory Visit: Payer: Self-pay | Admitting: Cardiology

## 2016-08-21 DIAGNOSIS — J060 Acute laryngopharyngitis: Secondary | ICD-10-CM | POA: Diagnosis not present

## 2016-08-21 DIAGNOSIS — J209 Acute bronchitis, unspecified: Secondary | ICD-10-CM | POA: Diagnosis not present

## 2016-08-21 DIAGNOSIS — J0141 Acute recurrent pansinusitis: Secondary | ICD-10-CM | POA: Diagnosis not present

## 2016-08-21 DIAGNOSIS — R062 Wheezing: Secondary | ICD-10-CM | POA: Diagnosis not present

## 2016-09-14 DIAGNOSIS — D649 Anemia, unspecified: Secondary | ICD-10-CM | POA: Diagnosis not present

## 2016-09-14 DIAGNOSIS — E876 Hypokalemia: Secondary | ICD-10-CM | POA: Diagnosis not present

## 2016-09-14 DIAGNOSIS — R7309 Other abnormal glucose: Secondary | ICD-10-CM | POA: Diagnosis not present

## 2016-09-14 DIAGNOSIS — Z13220 Encounter for screening for lipoid disorders: Secondary | ICD-10-CM | POA: Diagnosis not present

## 2016-10-20 DIAGNOSIS — E7800 Pure hypercholesterolemia, unspecified: Secondary | ICD-10-CM | POA: Diagnosis not present

## 2016-12-12 DIAGNOSIS — Z23 Encounter for immunization: Secondary | ICD-10-CM | POA: Diagnosis not present

## 2016-12-13 ENCOUNTER — Other Ambulatory Visit: Payer: Self-pay | Admitting: Family Medicine

## 2016-12-13 DIAGNOSIS — R1900 Intra-abdominal and pelvic swelling, mass and lump, unspecified site: Secondary | ICD-10-CM

## 2016-12-13 DIAGNOSIS — R935 Abnormal findings on diagnostic imaging of other abdominal regions, including retroperitoneum: Secondary | ICD-10-CM

## 2016-12-22 ENCOUNTER — Ambulatory Visit
Admission: RE | Admit: 2016-12-22 | Discharge: 2016-12-22 | Disposition: A | Discharge status: Home / Self Care | Payer: 59 | Source: Ambulatory Visit | Attending: Family Medicine | Admitting: Family Medicine

## 2016-12-22 DIAGNOSIS — R935 Abnormal findings on diagnostic imaging of other abdominal regions, including retroperitoneum: Secondary | ICD-10-CM

## 2016-12-22 DIAGNOSIS — R1900 Intra-abdominal and pelvic swelling, mass and lump, unspecified site: Secondary | ICD-10-CM

## 2016-12-22 DIAGNOSIS — K668 Other specified disorders of peritoneum: Secondary | ICD-10-CM | POA: Diagnosis not present

## 2016-12-22 MED ORDER — IOPAMIDOL (ISOVUE-300) INJECTION 61%: 100.0000 mL | Freq: Once | INTRAVENOUS | Status: DC | PRN

## 2016-12-22 MED ORDER — IOPAMIDOL (ISOVUE-300) INJECTION 61%
125.0000 mL | Freq: Once | INTRAVENOUS | Status: AC | PRN
  Administered 2016-12-22: 125 mL via INTRAVENOUS

## 2017-01-05 ENCOUNTER — Ambulatory Visit: Payer: Self-pay | Admitting: Surgery

## 2017-01-05 DIAGNOSIS — K668 Other specified disorders of peritoneum: Secondary | ICD-10-CM | POA: Diagnosis not present

## 2017-01-05 NOTE — H&P (Signed)
History of Present Illness Terri [name] M. Terri Donahue MD; 01/05/2017 3:00 PM) Patient words: CC: Omental mass HPI: Terri [name] is a pleasant 65 showed female with history of hypertension here today for evaluation of an omental mass seen on imaging.  She had noticed a left subcutaneous ball of fatty tissue which triggered an ultrasound.  A small cystic structure is seen it out all musculature which triggered a CAT scan - this was performed 06/07/16 - demonstrating no abdominal wall hernia, masses, or collections.  But incidentally did find a 1 cm low-attenuation nodule in the left upper quadrant omental fat and was noted to be of doubtful clinical significance of that time but recommended a repeat study in 6 months to confirm stability.  On 12/22/16 she underwent a repeat CAT scan of her abdomen and pelvis with contrast demonstrating a mild increase in size of the omental soft tissue nodule, now 1.6 cm in size in the anterior left abdomen. This has been causing her some anxiety.  She denies any abdominal pain or discomfort.  She denies history of obstructive symptoms.  She denies fevers/chills/night sweats.  She denies any changes in her weight.  She had a colonoscopy done approximately 6 years ago which was normal.  She states she is up-to-date on her mammograms as well as her GYN exams and Pap smears. PMH: Hypertension PSH: C-section via Pfannenstiel incision; laparoscopic abdominal hysterectomy for fibroids and menorrhagia. FHx: Denies FHx of malignancy SHx: Denies the use of tobacco/EtOH/drugs.  The patient is a 56 year old female.   Past Surgical History Terri [name], Terri [name]; 01/05/2017 2:21 PM) Cesarean Section - 1   Hysterectomy (not due to cancer) - Partial   Oral Surgery    Diagnostic Studies History Terri [name], [name]; 01/05/2017 2:21 PM) Colonoscopy   5-10 years ago Mammogram   1-3 years ago Pap Smear   1-5 years ago  Medication History Terri [name], [name]; 01/05/2017 2:24  PM) Rosuvastatin Calcium  (10MG  Tablet, Oral) Active. Carvedilol  (25MG  Tablet, Oral) Active. Entresto  (49-51MG  Tablet, Oral) Active. Furosemide  (20MG  Tablet, Oral) Active. Spironolactone  (25MG  Tablet, Oral) Active. Sacubitril-Valsartan  (Oral) Specific strength unknown - Active. Medications Reconciled   Social History Terri [name], Terri [name]; 01/05/2017 2:21 PM) Caffeine use   Carbonated beverages, Coffee, Tea. No alcohol use   No drug use   Tobacco use   Never smoker.  Family History Terri [name], Terri [name]; 01/05/2017 2:21 PM) Depression   Son. Diabetes Mellitus   Father, Mother. Heart Disease   Father. Hypertension   Brother, Father, Mother, Sister, Son. Kidney Disease   Mother. Prostate Cancer   Father. Thyroid problems   Mother.  Pregnancy / Birth History Terri [name], Terri [name]; 01/05/2017 2:21 PM) Age at menarche   42 years. Age of menopause   51-55 Contraceptive History   Oral contraceptives. Gravida   3 Maternal age   81-25 Para   2  Other Problems Terri [name], Terri [name]; 01/05/2017 2:21 PM) Congestive Heart Failure   Hemorrhoids   High blood pressure   Hypercholesterolemia      Review of Systems Terri [name] M. Terri Grable MD; 01/05/2017 3:01 PM) General Present- Night Sweats. Not Present- Appetite Loss, Chills, Fatigue, Fever, Weight Gain and Weight Loss. HEENT Present- Wears glasses/contact lenses. Not Present- Earache, Hearing Loss, Hoarseness, Nose Bleed, Oral Ulcers, Ringing in the Ears, Seasonal Allergies, Sinus Pain, Sore Throat, Visual Disturbances and Yellow Eyes. Respiratory Not Present- Chronic Cough, Decreased Exercise Tolerance and Difficulty Breathing on Exertion. Cardiovascular Not Present- Chest Pain and  Difficulty Breathing Lying Down. Gastrointestinal Not Present- Abdominal Pain, Black, Tarry Stool, Nausea and Vomiting. Female Genitourinary Present- Nocturia. Not Present- Frequency, Painful Urination, Pelvic Pain and Urgency. Musculoskeletal Not  Present- Back Pain, Joint Pain, Joint Stiffness, Muscle Pain, Muscle Weakness and Swelling of Extremities. Neurological Not Present- Decreased Memory, Fainting, Headaches, Numbness, Seizures, Tingling, Tremor, Trouble walking and Weakness. Psychiatric Not Present- Anxiety and Depression. Endocrine Present- Hot flashes. Not Present- Cold Intolerance, Excessive Hunger, Hair Changes, Heat Intolerance and New Diabetes. Hematology Not Present- Blood Thinners, Easy Bruising, Excessive bleeding, Gland problems, HIV and Persistent Infections.  Vitals Terri [name] [name]; 01/05/2017 2:24 PM) 01/05/2017 2:24 PM Weight: 186 lb   Height: 62 in  Body Surface Area: 1.85 m   Body Mass Index: 34.02 kg/m   Temp.: 97 F    Pulse: 68 (Regular)    BP: 118/72 (Sitting, Left Arm, Standard)       Physical Exam Terri [name] M. Terri Ow MD; 01/05/2017 3:02 PM) The physical exam findings are as follows: Note: Constitutional: No acute distress, conversant, no deformities Eyes: Moist conjunctiva, no lid lag, anicteric, PERRL Neck: Trachea midline; no palpable thyromegaly Lungs: Normal respiratory effort; no tactile fremitus CV: RRR; no palpable thrills; no pitting edema GI: Abdomen soft, NT; no palpable hepatosplenomegaly. Left upper quadrant soft, mobile, fatty region - 3x3cm, not clearly mass like nor lipoma - just appears to be adipose tissue. No overlying skin changes MSK: Normal gait; no clubbing/cyanosis Psych: Appropriate affect; alert and oriented x3 Lymphatic: No palpable cervical or axillary lymphadenopathy    Assessment & Plan Terri [name] M. Terri Pricer MD; 01/05/2017 3:06 PM) OMENTAL MASS (K66.8) Impression: 89F with omental mass in the left anterior abdomen -This has been causing significant concern/anxiety and she has requested it be removed. We discussed the possibility of attempting to biopsy this with interventional radiology. She brought up the potential of this is nondiagnostic and would still  require a procedure. Which I agree. Even if the biopsy came back benign she is concerned about ongoing observation and the potential of this could be or degenerate into a malignancy given that has changed in size in 6 months -I offered her diagnostic laparoscopy to further evaluate and if identified remove. We discussed that if this is not able to be identified during laparoscopy plana be to make a midline incision to remove it. The planned procedure including the use of left And open techniques was described. The anatomy and physiology of the GI tract was discussed with pictures and diagrams. The material risks principal including, but not limited to, pain, bleeding, infection, scarring, failure to identify lesion, need for additional procedures, damage to surrounding structures including bowel/bladder/blood vessel/nerve, development of hernias, heart attack, stroke, death) benefits and alternatives were discussed at length. Her questions were answered and she elected to proceed. -We will need preoperative clearance by her cardiologist prior to surgery. -Preoperative CBC, CMP, EKG  Signed electronically by Ileana Roup, MD (01/05/2017 3:07 PM)

## 2017-01-10 ENCOUNTER — Telehealth: Payer: Self-pay

## 2017-01-10 DIAGNOSIS — Z1231 Encounter for screening mammogram for malignant neoplasm of breast: Secondary | ICD-10-CM | POA: Diagnosis not present

## 2017-01-10 DIAGNOSIS — Z01419 Encounter for gynecological examination (general) (routine) without abnormal findings: Secondary | ICD-10-CM | POA: Diagnosis not present

## 2017-01-10 NOTE — Telephone Encounter (Signed)
   [facility] Pre-operative Risk Assessment    Request for surgical clearance:  1. What type of surgery is being performed? Diagnostic procedure   2. When is this surgery scheduled? TBD   3. Are there any medications that need to be held prior to surgery and how long?TBD by clearing provider. Office needs instructions as to if patient should HOLD medications preoperatively.   4. Practice name and name of physician performing surgery? [facility]   5. What is your office phone and fax number? Please fax clearance to [place], [name] phone: [phone]   6. Anesthesia type (None, local, MAC, general) ? General    Tod Persia 01/10/2017, 11:36 AM  _________________________________________________________________   (provider comments below)

## 2017-02-27 ENCOUNTER — Other Ambulatory Visit: Payer: Self-pay | Admitting: Cardiology

## 2017-04-17 ENCOUNTER — Other Ambulatory Visit: Payer: Self-pay | Admitting: Cardiology

## 2017-04-26 ENCOUNTER — Ambulatory Visit (HOSPITAL_COMMUNITY): Payer: 59 | Attending: Cardiovascular Disease

## 2017-04-26 ENCOUNTER — Encounter (INDEPENDENT_AMBULATORY_CARE_PROVIDER_SITE_OTHER): Payer: Self-pay

## 2017-04-26 ENCOUNTER — Other Ambulatory Visit: Payer: Self-pay

## 2017-04-26 DIAGNOSIS — I340 Nonrheumatic mitral (valve) insufficiency: Secondary | ICD-10-CM | POA: Diagnosis not present

## 2017-04-26 DIAGNOSIS — I110 Hypertensive heart disease with heart failure: Secondary | ICD-10-CM | POA: Insufficient documentation

## 2017-04-26 DIAGNOSIS — I5022 Chronic systolic (congestive) heart failure: Secondary | ICD-10-CM | POA: Insufficient documentation

## 2017-04-26 DIAGNOSIS — I313 Pericardial effusion (noninflammatory): Secondary | ICD-10-CM | POA: Insufficient documentation

## 2017-04-27 ENCOUNTER — Telehealth: Payer: Self-pay | Admitting: Cardiology

## 2017-04-27 ENCOUNTER — Telehealth: Payer: Self-pay

## 2017-04-27 DIAGNOSIS — I5030 Unspecified diastolic (congestive) heart failure: Secondary | ICD-10-CM

## 2017-04-27 NOTE — Telephone Encounter (Signed)
Called patient and LVM to call back with the desire time of her cardiac MRI.

## 2017-04-27 NOTE — Telephone Encounter (Signed)
Notes recorded by Teressa Senter, RN on 04/27/2017 at 12:23 PM EST Patient made aware of echo results and Dr. Theodosia Blender recommendation for cardiac MRI to assess ER. Patient in agreement with plan and thankful for the call. Cardiac MRI ordered, pt aware that schedulers will contact her to set up.  Notes recorded by Sueanne Margarita, MD on 04/26/2017 at 1:37 PM EST Please order cardiac MRI to get accurate assessment of EF to determine if she needs CRT given her LBBB and LV dysfunction

## 2017-05-01 ENCOUNTER — Encounter: Payer: Self-pay | Admitting: Cardiology

## 2017-05-21 ENCOUNTER — Ambulatory Visit (HOSPITAL_COMMUNITY)
Admission: RE | Admit: 2017-05-21 | Discharge: 2017-05-21 | Disposition: A | Discharge status: Home / Self Care | Payer: 59 | Source: Ambulatory Visit | Attending: Cardiology | Admitting: Cardiology

## 2017-05-21 DIAGNOSIS — I340 Nonrheumatic mitral (valve) insufficiency: Secondary | ICD-10-CM | POA: Diagnosis not present

## 2017-05-21 DIAGNOSIS — I5030 Unspecified diastolic (congestive) heart failure: Secondary | ICD-10-CM | POA: Diagnosis present

## 2017-05-21 DIAGNOSIS — I428 Other cardiomyopathies: Secondary | ICD-10-CM | POA: Diagnosis not present

## 2017-05-21 LAB — CREATININE, SERUM
Creatinine, Ser: 0.93 mg/dL (ref 0.44–1.00)
GFR calc Af Amer: >60 mL/min (ref >60)
GFR calc non Af Amer: >60 mL/min (ref >60)

## 2017-05-21 MED ORDER — GADOBENATE DIMEGLUMINE 529 MG/ML IV SOLN
30.0000 mL | Freq: Once | INTRAVENOUS | Status: AC
  Administered 2017-05-21: 27 mL via INTRAVENOUS

## 2017-05-23 ENCOUNTER — Encounter: Payer: Self-pay | Admitting: Cardiology

## 2017-05-24 ENCOUNTER — Telehealth: Payer: Self-pay | Admitting: Cardiology

## 2017-05-24 NOTE — Telephone Encounter (Signed)
Notes recorded by Teressa Senter, RN on 05/24/2017 at 4:56 PM EDT Patient made aware of results. She verbalized understanding and thankful for the call.   Notes recorded by Sueanne Margarita, MD on 05/23/2017 at 8:41 PM EDT Improved LVF which is now low normal. Mild MR

## 2017-05-24 NOTE — Telephone Encounter (Signed)
New Message ° ° ° °Pt is returning call  °

## 2017-06-08 ENCOUNTER — Encounter: Payer: Self-pay | Admitting: Cardiology

## 2017-06-14 ENCOUNTER — Encounter: Payer: Self-pay | Admitting: Cardiology

## 2017-06-14 ENCOUNTER — Ambulatory Visit: Payer: 59 | Admitting: Cardiology

## 2017-06-14 VITALS — BP 138/84 | HR 75 | Ht 62.0 in | Wt 191.4 lb

## 2017-06-14 DIAGNOSIS — I10 Essential (primary) hypertension: Secondary | ICD-10-CM | POA: Diagnosis not present

## 2017-06-14 DIAGNOSIS — I428 Other cardiomyopathies: Secondary | ICD-10-CM | POA: Diagnosis not present

## 2017-06-14 DIAGNOSIS — E7800 Pure hypercholesterolemia, unspecified: Secondary | ICD-10-CM | POA: Diagnosis not present

## 2017-06-14 DIAGNOSIS — I5022 Chronic systolic (congestive) heart failure: Secondary | ICD-10-CM

## 2017-06-14 LAB — BASIC METABOLIC PANEL
BUN / CREAT RATIO: 9 (ref 9–23)
BUN: 7 mg/dL (ref 6–24)
CALCIUM: 9.2 mg/dL (ref 8.7–10.2)
CO2: 23 mmol/L (ref 20–29)
Chloride: 107 mmol/L — ABNORMAL HIGH (ref 96–106)
Creatinine, Ser: 0.8 mg/dL (ref 0.57–1.00)
GFR calc Af Amer: 95 mL/min/{1.73_m2} (ref >59)
GFR calc non Af Amer: 82 mL/min/{1.73_m2} (ref >59)
GLUCOSE: 109 mg/dL — AB (ref 65–99)
Potassium: 4.1 mmol/L (ref 3.5–5.2)
Sodium: 141 mmol/L (ref 134–144)

## 2017-06-14 MED ORDER — SACUBITRIL-VALSARTAN 49-51 MG PO TABS: 1.0000 | ORAL_TABLET | Freq: Two times a day (BID) | ORAL | 10 refills | Status: DC

## 2017-06-14 MED ORDER — CARVEDILOL 25 MG PO TABS: 25.0000 mg | ORAL_TABLET | Freq: Two times a day (BID) | ORAL | 2 refills | Status: DC

## 2017-06-14 MED ORDER — SPIRONOLACTONE 50 MG PO TABS: 25.0000 mg | ORAL_TABLET | Freq: Every day | ORAL | 0 refills | Status: DC

## 2017-06-14 MED ORDER — FUROSEMIDE 20 MG PO TABS: 20.0000 mg | ORAL_TABLET | Freq: Every day | ORAL | 0 refills | Status: DC

## 2017-06-14 NOTE — Telephone Encounter (Signed)
   Pt was seen by Dr. Radford Pax in clinic today and cleared for surgery. Per Dr. Theodosia Blender note, "She says that she was found to have some type of small mass in the lining of her stomach and she has to have surgery for this.  She needs surgical clearance.  From a cardiac standpoint I think she is stable to undergo surgery to remove this.  Her revised cardiac risk index is 0.9 which places her in the low risk category for perioperative risk of major cardiac event.  She is fine to proceed with surgical procedure".  I will fax clearance to [facility] and will remove request from preop pool.

## 2017-06-14 NOTE — Progress Notes (Signed)
Cardiology Office Note:    Date:  06/14/2017   ID:  Terri [name], DOB 1960/12/04, MRN [id number]  PCP:  Maury Dus, MD  Cardiologist:  No primary care provider on file.    Referring MD: Maury Dus, MD   Chief Complaint  Patient presents with  . Congestive Heart Failure  . Hypertension  . Hyperlipidemia    History of Present Illness:    Terri [name] is a 57 y.o. female with a hx of NICM (presumed idiopathic - normal cors 2010, neg cardiac CT 25/4270), chronic systolic CHF, HTN, obesity, hyperlipidemia, LBBB. 2D echo 12/08/15 showed decline in LVEF with septal and apical akinesis, distal anterior wall and inferior wall hypokinesis, EF 30-35% (prev 62-37%), normal diastolic parameters, mild MR, mildly dilated LA. Spironolactone was added in 11/2015 and we switched ramipril to Entresto at [place] 12/2015 and then increased to 49/51mg  BID at [place] 02/09/2016.  Coronary CTA 02/03/16 showed calcium score of 0, no significant CAD.  She is here today for followup and is doing well.  She denies any chest pain or pressure, SOB, DOE, PND, orthopnea, LE edema, dizziness, palpitations or syncope. She is compliant with her meds and is tolerating meds with no SE.     Past Medical History:  Diagnosis Date  . Chronic systolic congestive heart failure ([place])   . Hypercholesteremia   . Hypertension   . LBBB (left bundle branch block)   . Nonischemic cardiomyopathy ([place])    a. LVEF 40-45%, presumed idiopathic by echo 05/2012. b. Decline in EF to 30-35% in 2017 - neg cardiac CT for CAD at that time 01/2016. EF 50% by cardiac MRI 04/2017  . Obesity     Past Surgical History:  Procedure Laterality Date  . CARDIAC CATHETERIZATION  2010   normal coronary arteries  . FEMORAL ARTERY STENT  06/2010   placement of stent in femoral artery for chronic LE edema  . IR GENERIC HISTORICAL  02/03/2016   IR US GUIDE VASC ACCESS RIGHT 02/03/2016 Corrie Mckusick, DO [facility]  . IR GENERIC HISTORICAL  02/03/2016   IR RADIOLOGY PERIPHERAL GUIDED IV START 02/03/2016 Corrie Mckusick, DO [facility]    Current Medications: Current Meds  Medication Sig  . carvedilol (COREG) 25 MG tablet TAKE ONE TABLET BY MOUTH TWICE DAILY WITH MEALS  . furosemide (LASIX) 20 MG tablet Take 1 tablet (20 mg total) by mouth daily. Please make overdue yearly appt with Dr. Radford Pax before anymore refills. 1st attempt  . rosuvastatin (CRESTOR) 10 MG tablet Take 10 mg by mouth daily.  . sacubitril-valsartan (ENTRESTO) 49-51 MG Take 1 tablet by mouth 2 (two) times daily.  Marland Kitchen spironolactone (ALDACTONE) 50 MG tablet TAKE 1/2 (ONE-HALF) TABLET BY MOUTH ONCE DAILY     Allergies:   Patient has no known allergies.   Social History   Socioeconomic History  . Marital status: Widowed    Spouse name: Not on file  . Number of children: Not on file  . Years of education: Not on file  . Highest education level: Not on file  Occupational History  . Not on file  Social Needs  . Financial resource strain: Not on file  . Food insecurity:    Worry: Not on file    Inability: Not on file  . Transportation needs:    Medical: Not on file    Non-medical: Not on file  Tobacco Use  . Smoking status: Never Smoker  . Smokeless tobacco: Never Used  Substance and Sexual  Activity  . Alcohol use: No  . Drug use: No  . Sexual activity: Not on file  Lifestyle  . Physical activity:    Days per week: Not on file    Minutes per session: Not on file  . Stress: Not on file  Relationships  . Social connections:    Talks on phone: Not on file    Gets together: Not on file    Attends religious service: Not on file    Active member of club or organization: Not on file    Attends meetings of clubs or organizations: Not on file    Relationship status: Not on file  Other Topics Concern  . Not on file  Social History Narrative  . Not on file     Family History: The patient's family history includes CVA in her mother; Hypertension in her brother,  brother, father, mother, and sister.  ROS:   Please see the history of present illness.    ROS  All other systems reviewed and negative.   EKGs/Labs/Other Studies Reviewed:    The following studies were reviewed today: none  EKG:  EKG is  ordered today.  The ekg ordered today demonstrates NSR with LBBB  Recent Labs: 05/21/2017: Creatinine, Ser 0.93   Recent Lipid Panel No results found for: CHOL, TRIG, HDL, CHOLHDL, VLDL, LDLCALC, LDLDIRECT  Physical Exam:    VS:  BP 138/84   Pulse 75   Ht 5\' 2"  (1.575 m)   Wt 191 lb 6.4 oz (86.8 kg)   SpO2 98%   BMI 35.01 kg/m     Wt Readings from Last 3 Encounters:  06/14/17 191 lb 6.4 oz (86.8 kg)  03/24/16 184 lb (83.5 kg)  02/09/16 180 lb (81.6 kg)     GEN:  Well nourished, well developed in no acute distress HEENT: Normal NECK: No JVD; No carotid bruits LYMPHATICS: No lymphadenopathy CARDIAC: RRR, no murmurs, rubs, gallops RESPIRATORY:  Clear to auscultation without rales, wheezing or rhonchi  ABDOMEN: Soft, non-tender, non-distended MUSCULOSKELETAL:  No edema; No deformity  SKIN: Warm and dry NEUROLOGIC:  Alert and oriented x 3 PSYCHIATRIC:  Normal affect   ASSESSMENT:    1. Chronic systolic congestive heart failure, NYHA class 2 ([place])   2. Essential hypertension, benign   3. Nonischemic cardiomyopathy ([place])   4. Hypercholesteremia    PLAN:    In order of problems listed above:  1.  Chronic systolic CHF NYHA class 2 -he appears euvolemic on exam today and weight is stable.  She will continue on carvedilol 25 mg twice daily, Lasix 20 mg daily, spironolactone 25 mg daily and Entresto 49-51 mg twice daily.  Creatinine was normal at 0.930 on 05/21/2017.  I will check a bmet today to make sure that her potassium is okay on Entresto and Spironolactone.  2.  HTN - BP is well controlled on exam today.  She will continue on carvedilol, Entresto and spironolactone.  3.  NIDCM -2D echocardiogram 04/26/2017 showed moderately  reduced LV function with EF 35-40% with septal apical and distal anterior wall hypokinesis but cardiac MRI done 05/21/2017 showed mildly dilated LV with marked paradoxical septal motion secondary to left bundle branch block and EF 52%.  There was no late gadolinium enhancement.  Likely the discrepancy in EF by echo was due to the paradoxical septal motion that made the EF appear reduced.  4.  Hyperlipidemia -lipids are followed by her PCP.  She will continue on Crestor 10 mg daily.  She says that she was found to have some type of small mass in the lining of her stomach and she has to have surgery for this.  She needs surgical clearance.  From a cardiac standpoint I think she is stable to undergo surgery to remove this.  Her revised cardiac risk index is 0.9 which places her in the low risk category for perioperative risk of major cardiac event.  She is fine to proceed with surgical procedure.   Medication Adjustments/Labs and Tests Ordered: Current medicines are reviewed at length with the patient today.  Concerns regarding medicines are outlined above.  No orders of the defined types were placed in this encounter.  No orders of the defined types were placed in this encounter.   Signed, Fransico Him, MD  06/14/2017 8:05 AM    [place]

## 2017-06-14 NOTE — Patient Instructions (Signed)
Medication Instructions:  Your physician recommends that you continue on your current medications as directed. Please refer to the Current Medication list given to you today.  If you need a refill on your cardiac medications, please contact your pharmacy first.  Labwork: Today for kidney function test   Testing/Procedures: None ordered   Follow-Up: Your physician wants you to follow-up in: 6 months with Dr. Radford Pax. You will receive a reminder letter in the mail two months in advance. If you don't receive a letter, please call our office to schedule the follow-up appointment.  Any Other Special Instructions Will Be Listed Below (If Applicable).   Thank you for choosing [facility], RN  [phone]  If you need a refill on your cardiac medications before your next appointment, please call your pharmacy.

## 2017-07-02 ENCOUNTER — Ambulatory Visit: Payer: Self-pay | Admitting: Surgery

## 2017-07-11 ENCOUNTER — Encounter (HOSPITAL_COMMUNITY): Payer: Self-pay

## 2017-07-11 NOTE — Pre-Procedure Instructions (Signed)
The following are in epic: Cardiac clearance and last office visit note Dr. Radford Pax 06/14/2017 in epic EKG 06/14/2017 ECHO 04/26/2017 Cardiac MRI 05/21/2017

## 2017-07-11 NOTE — Patient Instructions (Addendum)
Your procedure is scheduled on: Thursday, Jul 19, 2017   Surgery Time:  7:30AM-9:00AM   Report to [facility] Main  Entrance    Report to admitting at 5:30 AM   Call this number if you have problems the morning of surgery [phone]   Do not eat food or drink liquids :After Midnight.   Do NOT smoke after Midnight   Take these medicines the morning of surgery with A SIP OF WATER: Carvedilol                               You may not have any metal on your body including hair pins, jewelry, and body piercings             Do not wear make-up, lotions, powders, perfumes/cologne, or deodorant             Do not wear nail polish.  Do not shave  48 hours prior to surgery.                Do not bring valuables to the hospital. [place].   Contacts, dentures or bridgework may not be worn into surgery.    Patients discharged the day of surgery will not be allowed to drive home.   Name and phone number of your driver:  Arrie Aran (sister) [phone]   Special Instructions: Bring a copy of your healthcare power of attorney and living will documents         the day of surgery if you haven't scanned them in before.              Please read over the following fact sheets you were given:  [facility] - Preparing for Surgery Before surgery, you can play an important role.  Because skin is not sterile, your skin needs to be as free of germs as possible.  You can reduce the number of germs on your skin by washing with CHG (chlorahexidine gluconate) soap before surgery.  CHG is an antiseptic cleaner which kills germs and bonds with the skin to continue killing germs even after washing. Please DO NOT use if you have an allergy to CHG or antibacterial soaps.  If your skin becomes reddened/irritated stop using the CHG and inform your nurse when you arrive at Short Stay. Do not shave (including legs and underarms) for at least 48 hours  prior to the first CHG shower.  You may shave your face/neck.  Please follow these instructions carefully:  1.  Shower with CHG Soap the night before surgery and the  morning of surgery.  2.  If you choose to wash your hair, wash your hair first as usual with your normal  shampoo.  3.  After you shampoo, rinse your hair and body thoroughly to remove the shampoo.                             4.  Use CHG as you would any other liquid soap.  You can apply chg directly to the skin and wash.  Gently with a scrungie or clean washcloth.  5.  Apply the CHG Soap to your body ONLY FROM THE NECK DOWN.   Do   not use on face/ open  Wound or open sores. Avoid contact with eyes, ears mouth and   genitals (private parts).                       Wash face,  Genitals (private parts) with your normal soap.             6.  Wash thoroughly, paying special attention to the area where your    surgery  will be performed.  7.  Thoroughly rinse your body with warm water from the neck down.  8.  DO NOT shower/wash with your normal soap after using and rinsing off the CHG Soap.                9.  Pat yourself dry with a clean towel.            10.  Wear clean pajamas.            11.  Place clean sheets on your bed the night of your first shower and do not  sleep with pets. Day of Surgery : Do not apply any lotions/deodorants the morning of surgery.  Please wear clean clothes to the hospital/surgery center.  FAILURE TO FOLLOW THESE INSTRUCTIONS MAY RESULT IN THE CANCELLATION OF YOUR SURGERY  PATIENT SIGNATURE_________________________________  NURSE SIGNATURE__________________________________  ________________________________________________________________________

## 2017-07-12 ENCOUNTER — Encounter (HOSPITAL_COMMUNITY)
Admission: RE | Admit: 2017-07-12 | Discharge: 2017-07-12 | Disposition: A | Discharge status: Home / Self Care | Payer: 59 | Source: Ambulatory Visit | Attending: Surgery | Admitting: Surgery

## 2017-07-12 ENCOUNTER — Encounter (HOSPITAL_COMMUNITY): Payer: Self-pay

## 2017-07-12 ENCOUNTER — Other Ambulatory Visit: Payer: Self-pay

## 2017-07-12 DIAGNOSIS — N9489 Other specified conditions associated with female genital organs and menstrual cycle: Secondary | ICD-10-CM | POA: Diagnosis not present

## 2017-07-12 DIAGNOSIS — Z01812 Encounter for preprocedural laboratory examination: Secondary | ICD-10-CM | POA: Diagnosis present

## 2017-07-12 HISTORY — DX: Other specified postprocedural states: R11.2

## 2017-07-12 HISTORY — DX: Anemia, unspecified: D64.9

## 2017-07-12 HISTORY — DX: Other specified postprocedural states: Z98.890

## 2017-07-12 HISTORY — DX: Insomnia, unspecified: G47.00

## 2017-07-12 LAB — CBC WITH DIFFERENTIAL/PLATELET
Basophils Absolute: 0 10*3/uL (ref 0.0–0.1)
Basophils Relative: 0 %
EOS ABS: 0.1 10*3/uL (ref 0.0–0.7)
Eosinophils Relative: 2 %
HEMATOCRIT: 37.5 % (ref 36.0–46.0)
HEMOGLOBIN: 11.8 g/dL — AB (ref 12.0–15.0)
LYMPHS ABS: 2.2 10*3/uL (ref 0.7–4.0)
Lymphocytes Relative: 43 %
MCH: 25.7 pg — ABNORMAL LOW (ref 26.0–34.0)
MCHC: 31.5 g/dL (ref 30.0–36.0)
MCV: 81.5 fL (ref 78.0–100.0)
Monocytes Absolute: 0.3 10*3/uL (ref 0.1–1.0)
Monocytes Relative: 6 %
NEUTROS ABS: 2.5 10*3/uL (ref 1.7–7.7)
NEUTROS PCT: 49 %
Platelets: 192 10*3/uL (ref 150–400)
RBC: 4.6 MIL/uL (ref 3.87–5.11)
RDW: 14.5 % (ref 11.5–15.5)
WBC: 5.1 10*3/uL (ref 4.0–10.5)

## 2017-07-12 LAB — COMPREHENSIVE METABOLIC PANEL
ALBUMIN: 3.8 g/dL (ref 3.5–5.0)
ALK PHOS: 96 U/L (ref 38–126)
ALT: 19 U/L (ref 14–54)
AST: 21 U/L (ref 15–41)
Anion gap: 10 (ref 5–15)
BUN: 12 mg/dL (ref 6–20)
CALCIUM: 9.5 mg/dL (ref 8.9–10.3)
CO2: 26 mmol/L (ref 22–32)
Chloride: 107 mmol/L (ref 101–111)
Creatinine, Ser: 0.85 mg/dL (ref 0.44–1.00)
GFR calc Af Amer: >60 mL/min (ref >60)
GFR calc non Af Amer: >60 mL/min (ref >60)
GLUCOSE: 94 mg/dL (ref 65–99)
Potassium: 4.4 mmol/L (ref 3.5–5.1)
SODIUM: 143 mmol/L (ref 135–145)
Total Bilirubin: 0.6 mg/dL (ref 0.3–1.2)
Total Protein: 7.9 g/dL (ref 6.5–8.1)

## 2017-07-12 LAB — PROTIME-INR
INR: 1.03
Prothrombin Time: 13.5 seconds (ref 11.4–15.2)

## 2017-07-12 LAB — APTT: aPTT: 31 seconds (ref 24–36)

## 2017-07-12 NOTE — Pre-Procedure Instructions (Signed)
CBC/diff results 07/12/2017 faxed to Dr. Deland Pretty via epic.

## 2017-07-12 NOTE — Pre-Procedure Instructions (Signed)
Left chart with Terri [name] to follow up with HGB A 1C results 07/12/2017.

## 2017-07-13 LAB — HEMOGLOBIN A1C
Hgb A1c MFr Bld: 5.6 % (ref 4.8–5.6)
Mean Plasma Glucose: 114 mg/dL

## 2017-07-18 NOTE — Anesthesia Preprocedure Evaluation (Addendum)
Anesthesia Evaluation  Patient identified by MRN, date of birth, ID band Patient awake    Reviewed: Allergy & Precautions, NPO status , Patient's Chart, lab work & pertinent test results, reviewed documented beta blocker date and time   History of Anesthesia Complications (+) PONV and history of anesthetic complications  Airway Mallampati: II  TM Distance: >3 FB Neck ROM: Full    Dental no notable dental hx.    Pulmonary neg pulmonary ROS,    Pulmonary exam normal breath sounds clear to auscultation       Cardiovascular hypertension, Pt. on medications and Pt. on home beta blockers +CHF  Normal cardiovascular exam+ dysrhythmias  Rhythm:Regular Rate:Normal  echi 03/2017 - Left ventricle: Septal apical and distal anterior wall   hypokinesis. The cavity size was moderately dilated. Wall  thickness was increased in a pattern of mild LVH. Systolic  function was moderately reduced. The estimated ejection fraction  was in the range of 35% to 40%. Left ventricular diastolic function parameters were normal. - Mitral valve: There was mild regurgitation. - Left atrium: The atrium was mildly dilated. - Atrial septum: No defect or patent foramen ovale was identified. - Pericardium, extracardiac: A trivial pericardial effusion was identified.  Cardiac MRI: EF 52%, discrepancy from echo due to paradoxical septal motion showing falsely reduce EF on echo.   Neuro/Psych negative neurological ROS  negative psych ROS   GI/Hepatic negative GI ROS, Neg liver ROS,   Endo/Other  negative endocrine ROS  Renal/GU negative Renal ROS     Musculoskeletal negative musculoskeletal ROS (+)   Abdominal   Peds  Hematology  (+) anemia ,   Anesthesia Other Findings   Reproductive/Obstetrics negative OB ROS                            Anesthesia Physical Anesthesia Plan  ASA: III  Anesthesia Plan: General   Post-op  Pain Management:    Induction: Intravenous  PONV Risk Score and Plan: 4 or greater and Ondansetron, Dexamethasone, Midazolam and Treatment may vary due to age or medical condition  Airway Management Planned: Oral ETT  Additional Equipment:   Intra-op Plan:   Post-operative Plan: Extubation in OR  Informed Consent: I have reviewed the patients History and Physical, chart, labs and discussed the procedure including the risks, benefits and alternatives for the proposed anesthesia with the patient or authorized representative who has indicated his/her understanding and acceptance.   Dental advisory given  Plan Discussed with: CRNA  Anesthesia Plan Comments:         Anesthesia Quick Evaluation

## 2017-07-19 ENCOUNTER — Encounter (HOSPITAL_COMMUNITY)
Admission: RE | Disposition: A | Discharge status: Home / Self Care | Payer: Self-pay | Source: Ambulatory Visit | Attending: Surgery

## 2017-07-19 ENCOUNTER — Ambulatory Visit (HOSPITAL_COMMUNITY)
Admission: RE | Admit: 2017-07-19 | Discharge: 2017-07-19 | Disposition: A | Discharge status: Home / Self Care | Payer: 59 | Source: Ambulatory Visit | Attending: Surgery | Admitting: Surgery

## 2017-07-19 ENCOUNTER — Ambulatory Visit (HOSPITAL_COMMUNITY): Payer: 59 | Admitting: Anesthesiology

## 2017-07-19 ENCOUNTER — Encounter (HOSPITAL_COMMUNITY): Payer: Self-pay

## 2017-07-19 DIAGNOSIS — K660 Peritoneal adhesions (postprocedural) (postinfection): Secondary | ICD-10-CM | POA: Diagnosis not present

## 2017-07-19 DIAGNOSIS — E669 Obesity, unspecified: Secondary | ICD-10-CM | POA: Insufficient documentation

## 2017-07-19 DIAGNOSIS — I5022 Chronic systolic (congestive) heart failure: Secondary | ICD-10-CM | POA: Diagnosis not present

## 2017-07-19 DIAGNOSIS — Z6834 Body mass index (BMI) 34.0-34.9, adult: Secondary | ICD-10-CM | POA: Insufficient documentation

## 2017-07-19 DIAGNOSIS — E7800 Pure hypercholesterolemia, unspecified: Secondary | ICD-10-CM | POA: Diagnosis not present

## 2017-07-19 DIAGNOSIS — K668 Other specified disorders of peritoneum: Secondary | ICD-10-CM | POA: Diagnosis not present

## 2017-07-19 DIAGNOSIS — I110 Hypertensive heart disease with heart failure: Secondary | ICD-10-CM | POA: Diagnosis not present

## 2017-07-19 DIAGNOSIS — I428 Other cardiomyopathies: Secondary | ICD-10-CM | POA: Diagnosis not present

## 2017-07-19 DIAGNOSIS — R1909 Other intra-abdominal and pelvic swelling, mass and lump: Secondary | ICD-10-CM | POA: Diagnosis present

## 2017-07-19 HISTORY — PX: LAPAROSCOPY: SHX197

## 2017-07-19 SURGERY — LAPAROSCOPY, DIAGNOSTIC
Anesthesia: General

## 2017-07-19 MED ORDER — LIDOCAINE HCL 2 % IJ SOLN
INTRAMUSCULAR | Status: AC
  Filled 2017-07-19: qty 20

## 2017-07-19 MED ORDER — SUGAMMADEX SODIUM 500 MG/5ML IV SOLN
INTRAVENOUS | Status: AC
  Filled 2017-07-19: qty 10

## 2017-07-19 MED ORDER — PHENYLEPHRINE 40 MCG/ML (10ML) SYRINGE FOR IV PUSH (FOR BLOOD PRESSURE SUPPORT)
PREFILLED_SYRINGE | INTRAVENOUS | Status: DC | PRN
  Administered 2017-07-19 (×4): 80 ug via INTRAVENOUS

## 2017-07-19 MED ORDER — FENTANYL CITRATE (PF) 250 MCG/5ML IJ SOLN
INTRAMUSCULAR | Status: AC
  Filled 2017-07-19: qty 5

## 2017-07-19 MED ORDER — FENTANYL CITRATE (PF) 100 MCG/2ML IJ SOLN
25.0000 ug | INTRAMUSCULAR | Status: DC | PRN
  Administered 2017-07-19: 25 ug via INTRAVENOUS

## 2017-07-19 MED ORDER — CELECOXIB 200 MG PO CAPS
200.0000 mg | ORAL_CAPSULE | ORAL | Status: AC
  Administered 2017-07-19: 200 mg via ORAL
  Filled 2017-07-19: qty 1

## 2017-07-19 MED ORDER — CHLORHEXIDINE GLUCONATE CLOTH 2 % EX PADS: 6.0000 | MEDICATED_PAD | Freq: Once | CUTANEOUS | Status: DC

## 2017-07-19 MED ORDER — ROCURONIUM BROMIDE 10 MG/ML (PF) SYRINGE
PREFILLED_SYRINGE | INTRAVENOUS | Status: AC
  Filled 2017-07-19: qty 5

## 2017-07-19 MED ORDER — PHENYLEPHRINE 40 MCG/ML (10ML) SYRINGE FOR IV PUSH (FOR BLOOD PRESSURE SUPPORT)
PREFILLED_SYRINGE | INTRAVENOUS | Status: AC
  Filled 2017-07-19: qty 10

## 2017-07-19 MED ORDER — PROMETHAZINE HCL 25 MG/ML IJ SOLN: 6.2500 mg | INTRAMUSCULAR | Status: DC | PRN

## 2017-07-19 MED ORDER — MIDAZOLAM HCL 5 MG/5ML IJ SOLN
INTRAMUSCULAR | Status: DC | PRN
  Administered 2017-07-19: 2 mg via INTRAVENOUS

## 2017-07-19 MED ORDER — LACTATED RINGERS IV SOLN
INTRAVENOUS | Status: DC
  Administered 2017-07-19: 10:00:00 via INTRAVENOUS
  Administered 2017-07-19: 1000 mL via INTRAVENOUS

## 2017-07-19 MED ORDER — BUPIVACAINE-EPINEPHRINE 0.25% -1:200000 IJ SOLN
INTRAMUSCULAR | Status: DC | PRN
  Administered 2017-07-19: 30 mL

## 2017-07-19 MED ORDER — LIDOCAINE 2% (20 MG/ML) 5 ML SYRINGE
INTRAMUSCULAR | Status: DC | PRN
  Administered 2017-07-19: 40 mg via INTRAVENOUS

## 2017-07-19 MED ORDER — PROPOFOL 10 MG/ML IV BOLUS
INTRAVENOUS | Status: AC
  Filled 2017-07-19: qty 20

## 2017-07-19 MED ORDER — PROPOFOL 10 MG/ML IV BOLUS
INTRAVENOUS | Status: DC | PRN
  Administered 2017-07-19: 150 mg via INTRAVENOUS

## 2017-07-19 MED ORDER — LIDOCAINE 2% (20 MG/ML) 5 ML SYRINGE
INTRAMUSCULAR | Status: AC
  Filled 2017-07-19: qty 5

## 2017-07-19 MED ORDER — LIDOCAINE 2% (20 MG/ML) 5 ML SYRINGE
INTRAMUSCULAR | Status: DC | PRN
  Administered 2017-07-19: 1.5 mg/kg/h via INTRAVENOUS

## 2017-07-19 MED ORDER — KETAMINE HCL 10 MG/ML IJ SOLN
INTRAMUSCULAR | Status: DC | PRN
  Administered 2017-07-19: 40 mg via INTRAVENOUS

## 2017-07-19 MED ORDER — DEXAMETHASONE SODIUM PHOSPHATE 10 MG/ML IJ SOLN
INTRAMUSCULAR | Status: AC
  Filled 2017-07-19: qty 1

## 2017-07-19 MED ORDER — HEPARIN SODIUM (PORCINE) 5000 UNIT/ML IJ SOLN
5000.0000 [IU] | Freq: Once | INTRAMUSCULAR | Status: AC
  Administered 2017-07-19: 5000 [IU] via SUBCUTANEOUS
  Filled 2017-07-19: qty 1

## 2017-07-19 MED ORDER — DEXAMETHASONE SODIUM PHOSPHATE 10 MG/ML IJ SOLN
INTRAMUSCULAR | Status: DC | PRN
  Administered 2017-07-19: 10 mg via INTRAVENOUS

## 2017-07-19 MED ORDER — LIDOCAINE HCL 2 % IJ SOLN
INTRAMUSCULAR | Status: AC
  Filled 2017-07-19: qty 40

## 2017-07-19 MED ORDER — GLYCOPYRROLATE 0.2 MG/ML IV SOSY
PREFILLED_SYRINGE | INTRAVENOUS | Status: DC | PRN
  Administered 2017-07-19: .2 mg via INTRAVENOUS

## 2017-07-19 MED ORDER — TRAMADOL HCL 50 MG PO TABS: 50.0000 mg | ORAL_TABLET | Freq: Four times a day (QID) | ORAL | 0 refills | Status: AC | PRN

## 2017-07-19 MED ORDER — ONDANSETRON HCL 4 MG/2ML IJ SOLN
INTRAMUSCULAR | Status: AC
  Filled 2017-07-19: qty 2

## 2017-07-19 MED ORDER — MIDAZOLAM HCL 2 MG/2ML IJ SOLN
INTRAMUSCULAR | Status: AC
Start: 2017-07-19 — End: ?
  Filled 2017-07-19: qty 2

## 2017-07-19 MED ORDER — ACETAMINOPHEN 500 MG PO TABS
1000.0000 mg | ORAL_TABLET | ORAL | Status: AC
  Administered 2017-07-19: 1000 mg via ORAL
  Filled 2017-07-19: qty 2

## 2017-07-19 MED ORDER — KETAMINE HCL 10 MG/ML IJ SOLN
INTRAMUSCULAR | Status: AC
  Filled 2017-07-19: qty 1

## 2017-07-19 MED ORDER — CEFAZOLIN SODIUM-DEXTROSE 2-4 GM/100ML-% IV SOLN
2.0000 g | INTRAVENOUS | Status: AC
  Administered 2017-07-19: 2 g via INTRAVENOUS
  Filled 2017-07-19: qty 100

## 2017-07-19 MED ORDER — MEPERIDINE HCL 50 MG/ML IJ SOLN: 6.2500 mg | INTRAMUSCULAR | Status: DC | PRN

## 2017-07-19 MED ORDER — SUGAMMADEX SODIUM 200 MG/2ML IV SOLN
INTRAVENOUS | Status: AC
  Filled 2017-07-19: qty 2

## 2017-07-19 MED ORDER — FENTANYL CITRATE (PF) 250 MCG/5ML IJ SOLN
INTRAMUSCULAR | Status: DC | PRN
  Administered 2017-07-19 (×2): 50 ug via INTRAVENOUS

## 2017-07-19 MED ORDER — SUGAMMADEX SODIUM 200 MG/2ML IV SOLN
INTRAVENOUS | Status: DC | PRN
  Administered 2017-07-19: 400 mg via INTRAVENOUS

## 2017-07-19 MED ORDER — ONDANSETRON HCL 4 MG/2ML IJ SOLN
INTRAMUSCULAR | Status: DC | PRN
  Administered 2017-07-19: 4 mg via INTRAVENOUS

## 2017-07-19 MED ORDER — KETOROLAC TROMETHAMINE 30 MG/ML IJ SOLN
INTRAMUSCULAR | Status: AC
  Filled 2017-07-19: qty 1

## 2017-07-19 MED ORDER — ROCURONIUM BROMIDE 10 MG/ML (PF) SYRINGE
PREFILLED_SYRINGE | INTRAVENOUS | Status: DC | PRN
  Administered 2017-07-19: 20 mg via INTRAVENOUS
  Administered 2017-07-19: 50 mg via INTRAVENOUS

## 2017-07-19 MED ORDER — BUPIVACAINE-EPINEPHRINE (PF) 0.25% -1:200000 IJ SOLN
INTRAMUSCULAR | Status: AC
  Filled 2017-07-19: qty 30

## 2017-07-19 MED ORDER — GABAPENTIN 300 MG PO CAPS
300.0000 mg | ORAL_CAPSULE | ORAL | Status: AC
  Administered 2017-07-19: 300 mg via ORAL
  Filled 2017-07-19: qty 1

## 2017-07-19 MED ORDER — SUCCINYLCHOLINE CHLORIDE 200 MG/10ML IV SOSY
PREFILLED_SYRINGE | INTRAVENOUS | Status: AC
  Filled 2017-07-19: qty 10

## 2017-07-19 MED ORDER — FENTANYL CITRATE (PF) 100 MCG/2ML IJ SOLN
INTRAMUSCULAR | Status: AC
  Administered 2017-07-19: 25 ug via INTRAVENOUS
  Filled 2017-07-19: qty 2

## 2017-07-19 MED ORDER — BUPIVACAINE LIPOSOME 1.3 % IJ SUSP
20.0000 mL | Freq: Once | INTRAMUSCULAR | Status: AC
  Administered 2017-07-19: 20 mL
  Filled 2017-07-19: qty 20

## 2017-07-19 SURGICAL SUPPLY — 35 items
ADH SKN CLS APL DERMABOND .7 (GAUZE/BANDAGES/DRESSINGS) ×1
BAG SPEC RTRVL 10 TROC 200 (ENDOMECHANICALS) ×1
CHLORAPREP W/TINT 26ML (MISCELLANEOUS) ×2 IMPLANT
COVER SURGICAL LIGHT HANDLE (MISCELLANEOUS) ×2 IMPLANT
DECANTER SPIKE VIAL GLASS SM (MISCELLANEOUS) ×1 IMPLANT
DERMABOND ADVANCED (GAUZE/BANDAGES/DRESSINGS) ×1
DERMABOND ADVANCED .7 DNX12 (GAUZE/BANDAGES/DRESSINGS) IMPLANT
ELECT REM PT RETURN 15FT ADLT (MISCELLANEOUS) ×2 IMPLANT
GLOVE BIOGEL PI IND STRL 7.0 (GLOVE) ×1 IMPLANT
GLOVE BIOGEL PI INDICATOR 7.0 (GLOVE) ×1
GLOVE SURG ORTHO 8.0 STRL STRW (GLOVE) ×7 IMPLANT
GOWN STRL REUS W/TWL LRG LVL3 (GOWN DISPOSABLE) ×2 IMPLANT
GOWN STRL REUS W/TWL XL LVL3 (GOWN DISPOSABLE) ×4 IMPLANT
GRASPER SUT TROCAR 14GX15 (MISCELLANEOUS) ×1 IMPLANT
IRRIG SUCT STRYKERFLOW 2 WTIP (MISCELLANEOUS)
IRRIGATION SUCT STRKRFLW 2 WTP (MISCELLANEOUS) IMPLANT
KIT BASIN OR (CUSTOM PROCEDURE TRAY) ×2 IMPLANT
POUCH RETRIEVAL ECOSAC 10 (ENDOMECHANICALS) IMPLANT
POUCH RETRIEVAL ECOSAC 10MM (ENDOMECHANICALS) ×1
SHEARS HARMONIC ACE PLUS 36CM (ENDOMECHANICALS) IMPLANT
SLEEVE XCEL OPT CAN 5 100 (ENDOMECHANICALS) ×2 IMPLANT
SOLUTION ANTI FOG 6CC (MISCELLANEOUS) ×2 IMPLANT
STRIP CLOSURE SKIN 1/2X4 (GAUZE/BANDAGES/DRESSINGS) IMPLANT
SUT MNCRL AB 4-0 PS2 18 (SUTURE) ×1 IMPLANT
SUT VIC AB 4-0 PS2 27 (SUTURE) IMPLANT
TOWEL OR 17X26 10 PK STRL BLUE (TOWEL DISPOSABLE) ×2 IMPLANT
TRAY FOLEY MTR SLVR 16FR STAT (SET/KITS/TRAYS/PACK) IMPLANT
TRAY LAPAROSCOPIC (CUSTOM PROCEDURE TRAY) ×2 IMPLANT
TROCAR BLADELESS OPT 5 100 (ENDOMECHANICALS) ×1 IMPLANT
TROCAR XCEL 12X100 BLDLESS (ENDOMECHANICALS) ×1 IMPLANT
TROCAR XCEL BLUNT TIP 100MML (ENDOMECHANICALS) ×2 IMPLANT
TROCAR XCEL NON-BLD 11X100MML (ENDOMECHANICALS) IMPLANT
TROCAR XCEL UNIV SLVE 11M 100M (ENDOMECHANICALS) IMPLANT
TUBING INSUF HEATED (TUBING) ×2 IMPLANT
WATER STERILE IRR 1000ML POUR (IV SOLUTION) ×2 IMPLANT

## 2017-07-19 NOTE — Transfer of Care (Signed)
Immediate Anesthesia Transfer of Care Note  Patient: Terri [name]  Procedure(s) Performed: DIAGNOSTIC LAPAROSCOPY WITH PARTIAL OMENTECTOMY (N/A )  Patient Location: PACU  Anesthesia Type:General  Level of Consciousness: drowsy and patient cooperative  Airway & Oxygen Therapy: Patient Spontanous Breathing and Patient connected to face mask oxygen  Post-op Assessment: Report given to RN, Post -op Vital signs reviewed and stable and Patient moving all extremities  Post vital signs: Reviewed and stable  Last Vitals:  Vitals Value Taken Time  BP 138/82 07/19/2017 10:15 AM  Temp 36.6 C 07/19/2017 10:12 AM  Pulse 70 07/19/2017 10:16 AM  Resp 10 07/19/2017 10:16 AM  SpO2 89 % 07/19/2017 10:16 AM  Vitals shown include unvalidated device data.  Last Pain:  Vitals:   07/19/17 0607  TempSrc:   PainSc: 0-No pain      Patients Stated Pain Goal: 4 (17/61/60 7371)  Complications: No apparent anesthesia complications

## 2017-07-19 NOTE — Discharge Instructions (Addendum)
POST OP INSTRUCTIONS  1. DIET: As tolerated. Follow a light bland diet the first 24 hours after arrival home, such as soup, liquids, crackers, etc.  Be sure to include lots of fluids daily.  Avoid fast food or heavy meals as your are more likely to get nauseated.  Eat a low fat the next few days after surgery.  2. Take your usually prescribed home medications unless otherwise directed.  3. PAIN CONTROL: a. Pain is best controlled by a usual combination of three different methods TOGETHER: i. Ice/Heat ii. Over the counter pain medication iii. Prescription pain medication b. Most patients will experience some swelling and bruising around the surgical site.  Ice packs or heating pads (30-60 minutes up to 6 times a day) will help. Some people prefer to use ice alone, heat alone, alternating between ice & heat.  Experiment to what works for you.  Swelling and bruising can take several weeks to resolve.   c. It is helpful to take an over-the-counter pain medication regularly for the first few weeks: i. Ibuprofen (Motrin/Advil) - 200mg  tabs - take 3 tabs (600mg ) every 6 hours as needed for pain ii. Acetaminophen (Tylenol) - you may take 650mg  every 6 hours as needed. You can take this with motrin as they act differently on the body. If you are taking a narcotic pain medication that has acetaminophen in it, do not take over the counter tylenol at the same time.  Iii. NOTE: You may take both of these medications together - most patients  find it most helpful when alternating between the two (i.e. Ibuprofen at 6am,  tylenol at 9am, ibuprofen at 12pm ...) d. A  prescription for pain medication should be given to you upon discharge.  Take your pain medication as prescribed if your pain is not adequatly controlled with the over-the-counter pain reliefs mentioned above.  4. Avoid getting constipated.  Between the surgery and the pain medications, it is common to experience some constipation.  Increasing fluid  intake and taking a fiber supplement (such as Metamucil, Citrucel, FiberCon, MiraLax, etc) 1-2 times a day regularly will usually help prevent this problem from occurring.  A mild laxative (prune juice, Milk of Magnesia, MiraLax, etc) should be taken according to package directions if there are no bowel movements after 48 hours.    5. Dressing: Your incision is covered in Dermabond which is like sterile superglue for the skin. This will come off on it's own in a couple weeks. It is waterproof and you may bathe normally starting the day after your surgery in a shower. Avoid baths/pools/lakes/oceans until your wounds have fully healed.  6. ACTIVITIES as tolerated:   a. Avoid heavy lifting (>10lbs or 1 gallon of milk) for the next 6 weeks. b. You may resume regular (light) daily activities beginning the next day--such as daily self-care, walking, climbing stairs--gradually increasing activities as tolerated.  If you can walk 30 minutes without difficulty, it is safe to try more intense activity such as jogging, treadmill, bicycling, low-impact aerobics.  c. DO NOT PUSH THROUGH PAIN.  Let pain be your guide: If it hurts to do something, don't do it. d. Dennis Bast may drive when you are no longer taking prescription pain medication, you can comfortably wear a seatbelt, and you can safely maneuver your car and apply brakes. e. Dennis Bast may have sexual intercourse when it is comfortable.   7. FOLLOW UP in our office a. Please call [facility] at [phone] to set up an appointment  to see your surgeon in the office for a follow-up appointment approximately 2 weeks after your surgery. b. Make sure that you call for this appointment the day you arrive home to insure a convenient appointment time.  9. If you have disability or family leave forms that need to be completed, you may have them completed by your primary care physician's office; for return to work instructions, please ask our office staff and they will be happy to  assist you in obtaining this documentation   When to call us [phone]: 1. Poor pain control 2. Reactions / problems with new medications (rash/itching, etc)  3. Fever over 101.5 F (38.5 C) 4. Inability to urinate 5. Nausea/vomiting 6. Worsening swelling or bruising 7. Continued bleeding from incision. 8. Increased pain, redness, or drainage from the incision  The clinic staff is available to answer your questions during regular business hours (8:30am-5pm).  Please dont hesitate to call and ask to speak to one of our nurses for clinical concerns.   A surgeon from [facility] is always on call at the hospitals   If you have a medical emergency, go to the nearest emergency room or call 911.  [facility], [place] [address] [place], [place], [place]  [address] MAIN: [phone] FAX: [phone] [email or web address]  General Anesthesia, Adult, Care After These instructions provide you with information about caring for yourself after your procedure. Your health care provider may also give you more specific instructions. Your treatment has been planned according to current medical practices, but problems sometimes occur. Call your health care provider if you have any problems or questions after your procedure. What can I expect after the procedure? After the procedure, it is common to have:  Vomiting.  A sore throat.  Mental slowness.  It is common to feel:  Nauseous.  Cold or shivery.  Sleepy.  Tired.  Sore or achy, even in parts of your body where you did not have surgery.  Follow these instructions at home: For at least 24 hours after the procedure:  Do not: ? Participate in activities where you could fall or become injured. ? Drive. ? Use heavy machinery. ? Drink alcohol. ? Take sleeping pills or medicines that cause drowsiness. ? Make important decisions or sign legal documents. ? Take care of children on your  own.  Rest. Eating and drinking  If you vomit, drink water, juice, or soup when you can drink without vomiting.  Drink enough fluid to keep your urine clear or pale yellow.  Make sure you have little or no nausea before eating solid foods.  Follow the diet recommended by your health care provider. General instructions  Have a responsible adult stay with you until you are awake and alert.  Return to your normal activities as told by your health care provider. Ask your health care provider what activities are safe for you.  Take over-the-counter and prescription medicines only as told by your health care provider.  If you smoke, do not smoke without supervision.  Keep all follow-up visits as told by your health care provider. This is important. Contact a health care provider if:  You continue to have nausea or vomiting at home, and medicines are not helpful.  You cannot drink fluids or start eating again.  You cannot urinate after 8-12 hours.  You develop a skin rash.  You have fever.  You have increasing redness at the site of your procedure. Get help right away  if:  You have difficulty breathing.  You have chest pain.  You have unexpected bleeding.  You feel that you are having a life-threatening or urgent problem. This information is not intended to replace advice given to you by your health care provider. Make sure you discuss any questions you have with your health care provider. Document Released: 05/22/2000 Document Revised: 07/19/2015 Document Reviewed: 01/28/2015 Elsevier Interactive Patient Education  [employer].

## 2017-07-19 NOTE — Anesthesia Postprocedure Evaluation (Signed)
Anesthesia Post Note  Patient: Terri [name]  Procedure(s) Performed: DIAGNOSTIC LAPAROSCOPY WITH PARTIAL OMENTECTOMY (N/A )     Patient location during evaluation: PACU Anesthesia Type: General Level of consciousness: sedated and patient cooperative Pain management: pain level controlled Vital Signs Assessment: post-procedure vital signs reviewed and stable Respiratory status: spontaneous breathing Cardiovascular status: stable Anesthetic complications: no    Last Vitals:  Vitals:   07/19/17 1100 07/19/17 1145  BP: (!) 142/88 (!) 143/83  Pulse: 76 78  Resp: 16 14  Temp: 36.4 C 36.6 C  SpO2: 94% 96%    Last Pain:  Vitals:   07/19/17 1100  TempSrc:   PainSc: [place]

## 2017-07-19 NOTE — Op Note (Signed)
07/19/2017  9:58 AM  PATIENT:  Terri [name]  57 y.o. female  Patient Care Team: Terri Dus, MD as PCP - General (Family Medicine) Terri Margarita, MD as Consulting Physician (Cardiology)  PRE-OPERATIVE DIAGNOSIS:  Omental mass  POST-OPERATIVE DIAGNOSIS:  omental mass  PROCEDURE:   1. Diagnostic laparoscopy 2. Laparoscopic lysis of adhesions (30 minutes) 3. Partial omentectomy - gastrocolic omentum  SURGEON:  Terri Mt. Shakari Qazi, MD  ASSISTANT: None  ANESTHESIA:   general  COUNTS:  Sponge, needle and instrument counts were reported correct x2 at the conclusion of the operation.  EBL: 10cc  DRAINS: None  SPECIMEN: Gastrocolic omentum - silk suture adjacent to cystic lesion in the omentum  COMPLICATIONS: None  FINDINGS: Normal appearing liver surface. Peritoneum appeared normal throughout abdomen and on visceral surface of small bowel. Small 1.5cm cystic lesion in gastrocolic omentum - the associated omentum was excised and the cyst was noted to be intact in the omentum in the specimen jar. A suture was placed adjacent to the cyst marking it. Left pelvic side wall with small 1.5-2cm nodule - pictures taken and uploaded to PACS. Showed pictures intraoperatively to our gyn/onc Dr. Denman [name] - who recommended postoperative pelvic US and to see what omentum showed on path - possibly represents a remnant uterine fibroid. Did not appear to be associated with the left ovary nor fallopian tube.  DISPOSITION: PACU in satisfactory condition  INDICATION: Mrs. Terri [name] is a pleasant 2 showed female with history of hypertension here today for evaluation of an omental mass seen on imaging. She had noticed a left subcutaneous ball of fatty tissue which triggered an ultrasound. A small cystic structure is seen it out all musculature which triggered a CAT scan - this was performed 06/07/16 - demonstrating no abdominal wall hernia, masses, or collections. But incidentally did find a 1 cm  low-attenuation nodule in the left upper quadrant omental fat and was noted to be of doubtful clinical significance of that time but recommended a repeat study in 6 months to confirm stability. On 12/22/16 she underwent a repeat CAT scan of her abdomen and pelvis with contrast demonstrating a mild increase in size of the omental soft tissue nodule, now 1.6 cm in size in the anterior left abdomen.  This has been causing her some anxiety. She denies any abdominal pain or discomfort. She denies history of obstructive symptoms. She denies fevers/chills/night sweats. She denies any changes in her weight. She had a colonoscopy done approximately 6 years ago which was normal. She states she is up-to-date on her mammograms as well as her GYN exams and Pap smears.  She was scheduled for surgery but delayed due to issues with cardiac clearance. She has since received cardiac clearance including a MR cardiac. Dr. Radford [name] sent [place] clearance on 06/14/17 and she was then scheduled  We discussed proceeding with diagnostic laparoscopy and partial omentectomy of this lesion as opposed to IR biopsy.  The planned procedure including the use of laparoscopic and open techniques was described. The anatomy and physiology of the GI tract was discussed with pictures and diagrams. The material risks (including, but not limited to, pain, bleeding, infection, scarring, failure to identify lesion, need for additional procedures, damage to surrounding structures including bowel/bladder/blood vessel/nerve, development of hernias, heart attack, stroke, death) benefits and alternatives were discussed at length. Her questions were answered to her satisfaction, she voiced understanding and she elected to proceed.  DESCRIPTION: The patient was identified in preop holding and taken to the OR  where she was placed on the operating room table and SCDs were placed. General endotracheal anesthesia was induced without difficulty. The patient was  then prepped and draped in the usual sterile fashion. A surgical timeout was performed indicating the correct patient, procedure, positioning and need for preoperative antibiotics.   At the location of Palmer's point, a stab incision was made.  A Veress needle was inserted into the peritoneal cavity.  Intraperitoneal location was confirmed with a aspiration and saline drop test.  The abdomen was then insufflated with 15 mmHg using CO2.  A 5 mm trocar under direct visualization was then placed in the left upper quadrant.  Intraperitoneal inspection confirmed no evidence of trocar site complication.  2 additional 5 mm trochars were placed under direct visualization in the right abdomen.  Omental adhesions to the abdominal wall were taken down.  The patient was then positioned in reverse Trendelenburg.  The omental cyst was identified in the gastrocolic omentum.  Just inferior to the stomach, the lesser sac was entered through the translucent area of the gastrocolic omentum.  This was then opened using an Enseal device.  The pancreas and posterior surface of the stomach appeared normal.  The anterior surface of the stomach appeared normal.  The liver appeared normal.  The peritoneal cavity had no visible peritoneal implants.  The serosal surface of the visualized small bowel appeared normal.  The gastrocolic omentum and lesser sac was further opened and a swath of omentum was excised using the Enseal device, incorporating the cystic appearing lesion.  The left upper quadrant trocar was exchanged for a 12 mm port.  The specimen was then placed in a eco-sac and removed from the peritoneal cavity through this incision. The patient was then repositioned in Trendelenburg and the small bowel was swept out of the pelvis.  The left and right ovaries were inspected and appeared normal.  Both fallopian tubes appeared normal.  Within the scar bed of her likely prior hysterectomy on the left side near the inferior bladder but  again in scar, no bladder wall, and approximately 2 cm mass was seen.  Intraoperative photos were taken and these were uploaded to her chart.  I showed these pictures to one of our gynecologic oncologists during our procedure and the recommen through her gynecologist for furthdation was to leave this in situ but obtain a postoperative pelvic ultrasounder evaluation, but this looked most consistent with a retained fibroid.  No other abnormalities were noted in the perineal cavity.  At this point, attention was turned to closing.  The left upper quadrant trocar was removed and the fascial defect approximated using a two 0 Vicryl figure-of-eight sutures.  The peritoneal cavity is reinsufflated and inspection revealed this with repair to be intact and complete.  The fascial defect was palpated and noted to be closed.  All trochars were removed from the peritoneal cavity.  Skin of all incision sites was closed with a 4-0 Monocryl subcuticular suture.  Dermabond was applied to the skin. The specimen was re-examined - a silk marking suture was placed in the omentum marking the location of the cyst- just adjacent to suture. This was passed off as specimen.  The patient was then awakened from general anesthesia, extubated, and transferred to a stretcher for transport to PACU in satisfactory condition

## 2017-07-19 NOTE — H&P (Signed)
CC: Omental mass  HPI: Mrs. Streed is a pleasant 17 showed female with history of hypertension here today for evaluation of an omental mass seen on imaging. She had noticed a left subcutaneous ball of fatty tissue which triggered an ultrasound. A small cystic structure is seen it out all musculature which triggered a CAT scan - this was performed 06/07/16 - demonstrating no abdominal wall hernia, masses, or collections. But incidentally did find a 1 cm low-attenuation nodule in the left upper quadrant omental fat and was noted to be of doubtful clinical significance of that time but recommended a repeat study in 6 months to confirm stability. On 12/22/16 she underwent a repeat CAT scan of her abdomen and pelvis with contrast demonstrating a mild increase in size of the omental soft tissue nodule, now 1.6 cm in size in the anterior left abdomen. This has been causing her some anxiety. She denies any abdominal pain or discomfort. She denies history of obstructive symptoms. She denies fevers/chills/night sweats. She denies any changes in her weight. She had a colonoscopy done approximately 6 years ago which was normal. She states she is up-to-date on her mammograms as well as her GYN exams and Pap smears.  She was scheduled for surgery but delayed due to issues with cardiac clearance. She has since received cardiac clearance including a MR cardiac. Dr. Radford Pax sent [place] clearance on 06/14/17 and she was then scheduled  She reports no interval changes in her health. No new medications. Denies use of blood thinners.  PMH: Hypertension PSH: C-section via Pfannenstiel incision; abdominal hysterectomy for fibroids and menorrhagia. FHx: Denies FHx of malignancy SHx: Denies the use of tobacco/EtOH/drugs.    Past Medical History:  Diagnosis Date  . Anemia   . Chronic systolic congestive heart failure ([place])   . Hypercholesteremia   . Hypertension   . Insomnia   . LBBB (left bundle branch block)   .  Nonischemic cardiomyopathy ([place])    a. LVEF 40-45%, presumed idiopathic by echo 05/2012. b. Decline in EF to 30-35% in 2017 - neg cardiac CT for CAD at that time 01/2016. EF 50% by cardiac MRI 04/2017  . Obesity   . PONV (postoperative nausea and vomiting)     Past Surgical History:  Procedure Laterality Date  . ABDOMINAL HYSTERECTOMY    . CARDIAC CATHETERIZATION  2010   normal coronary arteries  . COLONOSCOPY    . fatty tumor removal Left    Axilla  . FEMORAL ARTERY STENT  06/2010   placement of stent in femoral artery for chronic LE edema  . IR GENERIC HISTORICAL  02/03/2016   IR US GUIDE VASC ACCESS RIGHT 02/03/2016 Corrie Mckusick, DO [facility]  . IR GENERIC HISTORICAL  02/03/2016   IR RADIOLOGY PERIPHERAL GUIDED IV START 02/03/2016 Corrie Mckusick, DO [facility]  . WISDOM TOOTH EXTRACTION      Family History  Problem Relation Age of Onset  . Hypertension Mother   . CVA Mother   . Hypertension Father   . Hypertension Sister   . Hypertension Brother   . Hypertension Brother     Social:  reports that she has never smoked. She has never used smokeless tobacco. She reports that she does not drink alcohol or use drugs.  Allergies: No Known Allergies  Medications: I have reviewed the patient's current medications.  No results found for this or any previous visit (from the past 48 hour(s)).  No results found.  ROS - all of the below systems  have been reviewed with the patient and positives are indicated with bold text General: chills, fever or night sweats Eyes: blurry vision or double vision ENT: epistaxis or sore throat Allergy/Immunology: itchy/watery eyes or nasal congestion Hematologic/Lymphatic: bleeding problems, blood clots or swollen lymph nodes Endocrine: temperature intolerance or unexpected weight changes Breast: new or changing breast lumps or nipple discharge Resp: cough, shortness of breath, or wheezing CV: chest pain or dyspnea on exertion GI: as per  HPI GU: dysuria, trouble voiding, or hematuria MSK: joint pain or joint stiffness Neuro: TIA or stroke symptoms Derm: pruritus and skin lesion changes Psych: anxiety and depression  PE Blood pressure (!) 143/79, pulse 80, temperature 98.5 F (36.9 C), temperature source Oral, resp. rate 18, height 5\' 2"  (1.575 m), weight 85.3 kg (188 lb), SpO2 100 %. Constitutional: NAD; conversant; no deformities Eyes: Moist conjunctiva; no lid lag; anicteric; PERRL Neck: Trachea midline; no thyromegaly Lungs: Normal respiratory effort; no tactile fremitus CV: RRR; no palpable thrills; no pitting edema GI: Abd soft,NT, ND; no palpable hepatosplenomegaly MSK: Normal gait; no clubbing/cyanosis Psychiatric: Appropriate affect; alert and oriented x3 Lymphatic: No palpable cervical or axillary lymphadenopathy    A/P: Mrs. Fowle is a very pleasant 57F with small omental mass in the left anterior abdomen -Will plan diagnostic laparoscopy, excision of omental mass, all other indicated procedures -This has been causing significant concern/anxiety and she has requested it be removed.We discussed that if this is not able to be identified during laparoscopy. The planned procedure including the use of laparoscopic and open techniques was described. The anatomy and physiology of the GI tract was discussed with pictures and diagrams. The material risks (including, but not limited to, pain, bleeding, infection, scarring, failure to identify lesion, need for additional procedures, damage to surrounding structures including bowel/bladder/blood vessel/nerve, development of hernias, heart attack, stroke, death) benefits and alternatives were discussed at length. Her questions were answered to her satisfaction, she voiced understanding and she elected to proceed.  Sharon Mt. Dema Severin, M.D. General and Colorectal Surgery [facility]

## 2017-07-19 NOTE — Anesthesia Procedure Notes (Signed)
Procedure Name: Intubation Date/Time: 07/19/2017 7:53 AM Performed by: Mitzie Na, CRNA Pre-anesthesia Checklist: Patient identified, Emergency Drugs available, Suction available, Timeout performed and Patient being monitored Patient Re-evaluated:Patient Re-evaluated prior to induction Oxygen Delivery Method: Circle system utilized Preoxygenation: Pre-oxygenation with 100% oxygen Induction Type: IV induction Ventilation: Mask ventilation without difficulty Laryngoscope Size: Miller and 2 Grade View: Grade I Tube type: Oral Tube size: 7.0 mm Number of attempts: 1 Airway Equipment and Method: Stylet Placement Confirmation: ETT inserted through vocal cords under direct vision,  positive ETCO2 and breath sounds checked- equal and bilateral Secured at: 22 cm Tube secured with: Tape Dental Injury: Teeth and Oropharynx as per pre-operative assessment

## 2017-08-17 DIAGNOSIS — R1900 Intra-abdominal and pelvic swelling, mass and lump, unspecified site: Secondary | ICD-10-CM | POA: Diagnosis not present

## 2017-11-04 ENCOUNTER — Other Ambulatory Visit: Payer: Self-pay | Admitting: Cardiology

## 2017-12-07 DIAGNOSIS — L089 Local infection of the skin and subcutaneous tissue, unspecified: Secondary | ICD-10-CM | POA: Diagnosis not present

## 2017-12-07 DIAGNOSIS — L03115 Cellulitis of right lower limb: Secondary | ICD-10-CM | POA: Diagnosis not present

## 2017-12-11 DIAGNOSIS — Z23 Encounter for immunization: Secondary | ICD-10-CM | POA: Diagnosis not present

## 2018-01-03 DIAGNOSIS — L720 Epidermal cyst: Secondary | ICD-10-CM | POA: Diagnosis not present

## 2018-01-03 DIAGNOSIS — D485 Neoplasm of uncertain behavior of skin: Secondary | ICD-10-CM | POA: Diagnosis not present

## 2018-01-03 DIAGNOSIS — D2239 Melanocytic nevi of other parts of face: Secondary | ICD-10-CM | POA: Diagnosis not present

## 2018-02-26 DIAGNOSIS — Z1231 Encounter for screening mammogram for malignant neoplasm of breast: Secondary | ICD-10-CM | POA: Diagnosis not present

## 2018-07-24 DIAGNOSIS — D485 Neoplasm of uncertain behavior of skin: Secondary | ICD-10-CM | POA: Diagnosis not present

## 2018-07-24 DIAGNOSIS — D225 Melanocytic nevi of trunk: Secondary | ICD-10-CM | POA: Diagnosis not present

## 2018-07-24 DIAGNOSIS — L723 Sebaceous cyst: Secondary | ICD-10-CM | POA: Diagnosis not present

## 2018-07-24 DIAGNOSIS — L821 Other seborrheic keratosis: Secondary | ICD-10-CM | POA: Diagnosis not present

## 2018-09-25 ENCOUNTER — Other Ambulatory Visit: Payer: Self-pay | Admitting: Cardiology

## 2018-09-26 ENCOUNTER — Telehealth: Payer: Self-pay

## 2018-09-26 NOTE — Telephone Encounter (Signed)

## 2018-09-30 ENCOUNTER — Encounter: Payer: Self-pay | Admitting: Cardiology

## 2018-09-30 ENCOUNTER — Telehealth: Payer: Self-pay | Admitting: *Deleted

## 2018-09-30 ENCOUNTER — Other Ambulatory Visit: Payer: Self-pay

## 2018-09-30 ENCOUNTER — Ambulatory Visit: Payer: 59 | Admitting: Cardiology

## 2018-09-30 VITALS — BP 120/70 | HR 84 | Ht 62.0 in | Wt 197.8 lb

## 2018-09-30 DIAGNOSIS — I428 Other cardiomyopathies: Secondary | ICD-10-CM

## 2018-09-30 DIAGNOSIS — R002 Palpitations: Secondary | ICD-10-CM | POA: Diagnosis not present

## 2018-09-30 DIAGNOSIS — I5042 Chronic combined systolic (congestive) and diastolic (congestive) heart failure: Secondary | ICD-10-CM | POA: Diagnosis not present

## 2018-09-30 MED ORDER — SPIRONOLACTONE 25 MG PO TABS: 25.0000 mg | ORAL_TABLET | Freq: Every day | ORAL | 3 refills | Status: DC

## 2018-09-30 NOTE — Progress Notes (Signed)
09/30/2018 Terri [name]   [birth year]  [id number]  Primary Physician Maury Dus, MD Primary Cardiologist: Fransico Him, MD  Electrophysiologist: None   Reason for Visit/CC: f/u for chronic systolic HF   HPI:  Terri [name] is a 58 y.o. female, followed by Dr. Radford Pax, presenting to clinic today for her annual cardiac assessment. She hasa hx of NICM (presumed idiopathic - normal cors 2010, neg cardiac CT 17/0017), chronic systolic CHF, HTN, obesity, hyperlipidemia and LBBB. 2D echo10/11/17 showed decline in LVEF withseptal and apical akinesis, distal anterior wall and inferior wall hypokinesis, EF 30-35% (prev 49-44%), normal diastolic parameters, mild MR, mildly dilated LA. Coronary CTA 02/03/16 showed calcium score of 0, no significant CAD. She has been managed with guidelines directed medical therapy. She is on Entresto, Coreg and spironolactone. She is on Lasix for volume control. Also on statin therapy for LDL. She takes Crestor.   Per last OV note in 2019, she had a cardiac MRI done 05/21/2017 that showed mildly dilated LV with marked paradoxical septal motion secondary to left bundle branch block and EF 52%.  There was no late gadolinium enhancement. Echo 03/2017 showed an EF of 35-40%. It was felt likely the discrepancy in EF by echo was due to the paradoxical septal motion that made the EF appear reduced.  Today in follow-up, she reports that she has done fairly well.  She denies any major changes to her health over the last year.  No hospitalizations, surgeries or ER visits.  Stable from a HF symptom standpoint.  She denies chest pain.  No resting dyspnea, no exertional dyspnea, no lower extremity edema, orthopnea or PND.  She reports full medication compliance.  Tolerating medications well without any side effects.  Blood pressure is well controlled today at 120/70.  Heart rate 54.  Euvolemic on physical exam.  Her one concern however is the development of palpitations, that have  been increasing in frequency. Has been occurring, off and on, over the last several weeks.  She mostly feels palpitations, described as a fluttering in her chest at night when she is trying to sleep.  No other associated symptoms.   Currently asymptomatic in clinic today. EKG shows NSR. 84 bpm  Cardiac Studies   2D Echo 03/2017 Study Conclusions  - Left ventricle: Septal apical and distal anterior wall   hypokinesis. The cavity size was moderately dilated. Wall   thickness was increased in a pattern of mild LVH. Systolic   function was moderately reduced. The estimated ejection fraction   was in the range of 35% to 40%. Left ventricular diastolic   function parameters were normal. - Mitral valve: There was mild regurgitation. - Left atrium: The atrium was mildly dilated. - Atrial septum: No defect or patent foramen ovale was identified. - Pericardium, extracardiac: A trivial pericardial effusion was   identified.   Cardiac MRI 04/2017 IMPRESSION: 1. Mildly dilated left ventricle with mild basal septal hypertrophy and mildly decreased systolic function (LVEF = 52% ). There is very pronounced paradoxical septal motion most likely secondary to LBBB.  There is no late gadolinium enhancement in the left ventricular myocardium.  Improvement of LVEF when compared to the prior echocardiogram in February 2019.  2. Normal right ventricular size, thickness and systolic function (LVEF = 61 %). There are no regional wall motion abnormalities.  3. Mild mitral regurgitation.   Current Meds  Medication Sig  . carvedilol (COREG) 25 MG tablet TAKE 1 TABLET BY MOUTH TWICE DAILY WITH  A  MEAL  . ENTRESTO 49-51 MG Take 1 tablet by mouth twice daily  . furosemide (LASIX) 20 MG tablet Take 1 tablet (20 mg total) by mouth daily.  . [DISCONTINUED] spironolactone (ALDACTONE) 50 MG tablet TAKE 1/2 (ONE-HALF) TABLET BY MOUTH ONCE DAILY   No Known Allergies Past Medical History:  Diagnosis  Date  . Anemia   . Chronic systolic congestive heart failure ([place])   . Hypercholesteremia   . Hypertension   . Insomnia   . LBBB (left bundle branch block)   . Nonischemic cardiomyopathy ([place])    a. LVEF 40-45%, presumed idiopathic by echo 05/2012. b. Decline in EF to 30-35% in 2017 - neg cardiac CT for CAD at that time 01/2016. EF 50% by cardiac MRI 04/2017  . Obesity   . PONV (postoperative nausea and vomiting)    Family History  Problem Relation Age of Onset  . Hypertension Mother   . CVA Mother   . Hypertension Father   . Hypertension Sister   . Hypertension Brother   . Hypertension Brother    Past Surgical History:  Procedure Laterality Date  . ABDOMINAL HYSTERECTOMY    . CARDIAC CATHETERIZATION  2010   normal coronary arteries  . COLONOSCOPY    . fatty tumor removal Left    Axilla  . FEMORAL ARTERY STENT  06/2010   placement of stent in femoral artery for chronic LE edema  . IR GENERIC HISTORICAL  02/03/2016   IR US GUIDE VASC ACCESS RIGHT 02/03/2016 Corrie Mckusick, DO [facility]  . IR GENERIC HISTORICAL  02/03/2016   IR RADIOLOGY PERIPHERAL GUIDED IV START 02/03/2016 Corrie Mckusick, DO [facility]  . LAPAROSCOPY N/A 07/19/2017   Procedure: DIAGNOSTIC LAPAROSCOPY WITH PARTIAL OMENTECTOMY;  Surgeon: Ileana Roup, MD;  Location: [facility];  Service: General;  Laterality: N/A;  . WISDOM TOOTH EXTRACTION     Social History   Socioeconomic History  . Marital status: Widowed    Spouse name: Not on file  . Number of children: Not on file  . Years of education: Not on file  . Highest education level: Not on file  Occupational History  . Not on file  Social Needs  . Financial resource strain: Not on file  . Food insecurity    Worry: Not on file    Inability: Not on file  . Transportation needs    Medical: Not on file    Non-medical: Not on file  Tobacco Use  . Smoking status: Never Smoker  . Smokeless tobacco: Never Used  Substance and Sexual Activity  .  Alcohol use: No  . Drug use: No  . Sexual activity: Not on file  Lifestyle  . Physical activity    Days per week: Not on file    Minutes per session: Not on file  . Stress: Not on file  Relationships  . Social Herbalist on phone: Not on file    Gets together: Not on file    Attends religious service: Not on file    Active member of club or organization: Not on file    Attends meetings of clubs or organizations: Not on file    Relationship status: Not on file  . Intimate partner violence    Fear of current or ex partner: Not on file    Emotionally abused: Not on file    Physically abused: Not on file    Forced sexual activity: Not on file  Other Topics Concern  .  Not on file  Social History Narrative  . Not on file     Lipid Panel  No results found for: CHOL, TRIG, HDL, CHOLHDL, VLDL, LDLCALC, LDLDIRECT  Review of Systems: General: negative for chills, fever, night sweats or weight changes.  Cardiovascular: negative for chest pain, dyspnea on exertion, edema, orthopnea, palpitations, paroxysmal nocturnal dyspnea or shortness of breath Dermatological: negative for rash Respiratory: negative for cough or wheezing Urologic: negative for hematuria Abdominal: negative for nausea, vomiting, diarrhea, bright red blood per rectum, melena, or hematemesis Neurologic: negative for visual changes, syncope, or dizziness All other systems reviewed and are otherwise negative except as noted above.   Physical Exam:  Blood pressure 120/70, pulse 84, height 5\' 2"  (1.575 m), weight 197 lb 12.8 oz (89.7 kg), SpO2 98 %.  General appearance: alert, cooperative and no distress Neck: no carotid bruit and no JVD Lungs: clear to auscultation bilaterally Heart: regular rate and rhythm, S1, S2 normal, no murmur, click, rub or gallop Extremities: extremities normal, atraumatic, no cyanosis or edema Pulses: 2+ and symmetric Skin: Skin color, texture, turgor normal. No rashes or lesions  Neurologic: Grossly normal  EKG NSR nonspecific intraventricular block -- personally reviewed   ASSESSMENT AND PLAN:   1. Chronic Systolic HF, NYHA Functional Class II: doing well. No exertional dyspnea. Euvolemic on exam. No edema or JVD. Lungs are CTAB. BP controlled. Continue lasix for volume control. We dicussed importance of daily weights and low sodium diet.   2. NICM:  She has been managed with guidelines directed medical therapy. She is on Entresto, Coreg and spironolactone. She is on Lasix for volume control. Last echo 03/2017 showed EF to be 35-40% but cardiac MRI 04/2017 showed mildly dilated LV with marked paradoxical septal motion secondary to left bundle branch block and EF 52%.  There was no late gadolinium enhancement. It was felt likely the discrepancy in EF by echo was due to the paradoxical septal motion that made the EF appear reduced. We will continue current medical regimen for now. Will order BMP to check renal function and K given Entresto, Spironolactone and Lasix.   3. HTN: controlled on current regimen. No changes made today.   4. HLD: on statin therapy w/ Crestor. Lipid profile followed by PCP.   5. Palpitations: described as "flutter" sensation in her chest, mainly at night. Occurrences increasing in frequency. Currently  asymptomatic. EKG shows NSR. Will order 2 week Zio patch to assess. Check BMP today. Continue on Coreg. Will adjust dose if needed based on monitor results.    Follow-Up in 6 months w/ Dr. Radford Pax, will arrange sooner f/u if abnormal cardiac monitor.   Jayren Cease Ladoris Gene, MHS [facility] 09/30/2018 12:07 PM

## 2018-09-30 NOTE — Telephone Encounter (Signed)
14 day Zio XT long term holter monitor to be mailed to patients home.  Instructions reviewed briefly as the are included in the monitor kit.

## 2018-09-30 NOTE — Patient Instructions (Signed)
Medication Instructions:  none If you need a refill on your cardiac medications before your next appointment, please call your pharmacy.   Lab work: none If you have labs (blood work) drawn today and your tests are completely normal, you will receive your results only by: Marland Kitchen MyChart Message (if you have MyChart) OR . A paper copy in the mail If you have any lab test that is abnormal or we need to change your treatment, we will call you to review the results.  Testing/Procedures: ZIO Monitor for palpitations  Someone will contact you  Follow-Up: At [facility], you and your health needs are our priority.  As part of our continuing mission to provide you with exceptional heart care, we have created designated Provider Care Teams.  These Care Teams include your primary Cardiologist (physician) and Advanced Practice Providers (APPs -  Physician Assistants and Nurse Practitioners) who all work together to provide you with the care you need, when you need it. You will need a follow up appointment in 6 months.  Please call our office 2 months in advance to schedule this appointment.  You may see Fransico Him, MD or one of the following Advanced Practice Providers on your designated Care Team:   [place], PA-C Melina Copa, PA-C . Ermalinda Barrios, PA-C  Any Other Special Instructions Will Be Listed Below (If Applicable). WEIGHT:  Weigh yourself daily.  If you gain more than 3 lbs in 1 day or 5 lbs in 1 week, call the office.  DASH Eating Plan DASH stands for "Dietary Approaches to Stop Hypertension." The DASH eating plan is a healthy eating plan that has been shown to reduce high blood pressure (hypertension). It may also reduce your risk for type 2 diabetes, heart disease, and stroke. The DASH eating plan may also help with weight loss. What are tips for following this plan?  General guidelines  Avoid eating more than 2,300 mg (milligrams) of salt (sodium) a day. If you have  hypertension, you may need to reduce your sodium intake to 1,500 mg a day.  Limit alcohol intake to no more than 1 drink a day for nonpregnant women and 2 drinks a day for men. One drink equals 12 oz of beer, 5 oz of wine, or 1 oz of hard liquor.  Work with your health care provider to maintain a healthy body weight or to lose weight. Ask what an ideal weight is for you.  Get at least 30 minutes of exercise that causes your heart to beat faster (aerobic exercise) most days of the week. Activities may include walking, swimming, or biking.  Work with your health care provider or diet and nutrition specialist (dietitian) to adjust your eating plan to your individual calorie needs. Reading food labels   Check food labels for the amount of sodium per serving. Choose foods with less than 5 percent of the Daily Value of sodium. Generally, foods with less than 300 mg of sodium per serving fit into this eating plan.  To find whole grains, look for the word "whole" as the first word in the ingredient list. Shopping  Buy products labeled as "low-sodium" or "no salt added."  Buy fresh foods. Avoid canned foods and premade or frozen meals. Cooking  Avoid adding salt when cooking. Use salt-free seasonings or herbs instead of table salt or sea salt. Check with your health care provider or pharmacist before using salt substitutes.  Do not fry foods. Cook foods using healthy methods such as baking,  boiling, grilling, and broiling instead.  Cook with heart-healthy oils, such as olive, canola, soybean, or sunflower oil. Meal planning  Eat a balanced diet that includes: ? 5 or more servings of fruits and vegetables each day. At each meal, try to fill half of your plate with fruits and vegetables. ? Up to 6-8 servings of whole grains each day. ? Less than 6 oz of lean meat, poultry, or fish each day. A 3-oz serving of meat is about the same size as a deck of cards. One egg equals 1 oz. ? 2 servings of  low-fat dairy each day. ? A serving of nuts, seeds, or beans 5 times each week. ? Heart-healthy fats. Healthy fats called Omega-3 fatty acids are found in foods such as flaxseeds and coldwater fish, like sardines, salmon, and mackerel.  Limit how much you eat of the following: ? Canned or prepackaged foods. ? Food that is high in trans fat, such as fried foods. ? Food that is high in saturated fat, such as fatty meat. ? Sweets, desserts, sugary drinks, and other foods with added sugar. ? Full-fat dairy products.  Do not salt foods before eating.  Try to eat at least 2 vegetarian meals each week.  Eat more home-cooked food and less restaurant, buffet, and fast food.  When eating at a restaurant, ask that your food be prepared with less salt or no salt, if possible. What foods are recommended? The items listed may not be a complete list. Talk with your dietitian about what dietary choices are best for you. Grains Whole-grain or whole-wheat bread. Whole-grain or whole-wheat pasta. Brown rice. Modena Morrow. Bulgur. Whole-grain and low-sodium cereals. Pita bread. Low-fat, low-sodium crackers. Whole-wheat flour tortillas. Vegetables Fresh or frozen vegetables (raw, steamed, roasted, or grilled). Low-sodium or reduced-sodium tomato and vegetable juice. Low-sodium or reduced-sodium tomato sauce and tomato paste. Low-sodium or reduced-sodium canned vegetables. Fruits All fresh, dried, or frozen fruit. Canned fruit in natural juice (without added sugar). Meat and other protein foods Skinless chicken or [place]. Ground chicken or [place]. Pork with fat trimmed off. Fish and seafood. Egg whites. Dried beans, peas, or lentils. Unsalted nuts, nut butters, and seeds. Unsalted canned beans. Lean cuts of beef with fat trimmed off. Low-sodium, lean deli meat. Dairy Low-fat (1%) or fat-free (skim) milk. Fat-free, low-fat, or reduced-fat cheeses. Nonfat, low-sodium ricotta or cottage cheese. Low-fat or  nonfat yogurt. Low-fat, low-sodium cheese. Fats and oils Soft margarine without trans fats. Vegetable oil. Low-fat, reduced-fat, or light mayonnaise and salad dressings (reduced-sodium). Canola, safflower, olive, soybean, and sunflower oils. Avocado. Seasoning and other foods Herbs. Spices. Seasoning mixes without salt. Unsalted popcorn and pretzels. Fat-free sweets. What foods are not recommended? The items listed may not be a complete list. Talk with your dietitian about what dietary choices are best for you. Grains Baked goods made with fat, such as croissants, muffins, or some breads. Dry pasta or rice meal packs. Vegetables Creamed or fried vegetables. Vegetables in a cheese sauce. Regular canned vegetables (not low-sodium or reduced-sodium). Regular canned tomato sauce and paste (not low-sodium or reduced-sodium). Regular tomato and vegetable juice (not low-sodium or reduced-sodium). Angie Fava. Olives. Fruits Canned fruit in a light or heavy syrup. Fried fruit. Fruit in cream or butter sauce. Meat and other protein foods Fatty cuts of meat. Ribs. Fried meat. Berniece Salines. Sausage. Bologna and other processed lunch meats. Salami. Fatback. Hotdogs. Bratwurst. Salted nuts and seeds. Canned beans with added salt. Canned or smoked fish. Whole eggs or egg yolks. Chicken  or [place] with skin. Dairy Whole or 2% milk, cream, and half-and-half. Whole or full-fat cream cheese. Whole-fat or sweetened yogurt. Full-fat cheese. Nondairy creamers. Whipped toppings. Processed cheese and cheese spreads. Fats and oils Butter. Stick margarine. Lard. Shortening. Ghee. Bacon fat. Tropical oils, such as coconut, palm kernel, or palm oil. Seasoning and other foods Salted popcorn and pretzels. Onion salt, garlic salt, seasoned salt, table salt, and sea salt. Worcestershire sauce. Tartar sauce. Barbecue sauce. Teriyaki sauce. Soy sauce, including reduced-sodium. Steak sauce. Canned and packaged gravies. Fish sauce. Oyster  sauce. Cocktail sauce. Horseradish that you find on the shelf. Ketchup. Mustard. Meat flavorings and tenderizers. Bouillon cubes. Hot sauce and Tabasco sauce. Premade or packaged marinades. Premade or packaged taco seasonings. Relishes. Regular salad dressings. Where to find more information:  National Heart, Lung, and [place]: https://wilson-eaton.com/  American Heart Association: www.heart.org Summary  The DASH eating plan is a healthy eating plan that has been shown to reduce high blood pressure (hypertension). It may also reduce your risk for type 2 diabetes, heart disease, and stroke.  With the DASH eating plan, you should limit salt (sodium) intake to 2,300 mg a day. If you have hypertension, you may need to reduce your sodium intake to 1,500 mg a day.  When on the DASH eating plan, aim to eat more fresh fruits and vegetables, whole grains, lean proteins, low-fat dairy, and heart-healthy fats.  Work with your health care provider or diet and nutrition specialist (dietitian) to adjust your eating plan to your individual calorie needs. This information is not intended to replace advice given to you by your health care provider. Make sure you discuss any questions you have with your health care provider. Document Released: 02/02/2011 Document Revised: 01/26/2017 Document Reviewed: 02/07/2016 Elsevier Patient Education  2020 [employer].

## 2018-10-05 ENCOUNTER — Ambulatory Visit (INDEPENDENT_AMBULATORY_CARE_PROVIDER_SITE_OTHER): Payer: 59

## 2018-10-05 DIAGNOSIS — I5042 Chronic combined systolic (congestive) and diastolic (congestive) heart failure: Secondary | ICD-10-CM | POA: Diagnosis not present

## 2018-10-05 DIAGNOSIS — R002 Palpitations: Secondary | ICD-10-CM

## 2018-10-30 ENCOUNTER — Telehealth: Payer: Self-pay

## 2018-10-30 NOTE — Telephone Encounter (Signed)
Notes recorded by Frederik Schmidt, RN on 10/30/2018 at 8:52 AM EDT  lpmtcb 9/2  ------

## 2018-10-30 NOTE — Telephone Encounter (Signed)
-----   Message from Terri Margarita, MD sent at 10/29/2018 10:24 PM EDT ----- Heart monitor showed a few extra heart beats from the bottom of the heart which are usually benign.  Please have her come in for BMET with Mag and TSH.  Repeat 2D echo to reassess LVF.  Cannot increase carvedilol further and due to DCM in the past would avoid CCB.  Please find out if she is having any sx.

## 2018-10-31 ENCOUNTER — Telehealth: Payer: Self-pay

## 2018-10-31 DIAGNOSIS — I5022 Chronic systolic (congestive) heart failure: Secondary | ICD-10-CM

## 2018-10-31 DIAGNOSIS — I428 Other cardiomyopathies: Secondary | ICD-10-CM

## 2018-10-31 NOTE — Telephone Encounter (Signed)
Notes recorded by Frederik Schmidt, RN on 10/31/2018 at 3:54 PM EDT  The patient has been notified of the result and verbalized understanding. All questions (if any) were answered.  Frederik Schmidt, RN 10/31/2018 3:54 PM

## 2018-10-31 NOTE — Telephone Encounter (Signed)
-----   Message from Sueanne Margarita, MD sent at 10/29/2018 10:24 PM EDT ----- Heart monitor showed a few extra heart beats from the bottom of the heart which are usually benign.  Please have her come in for BMET with Mag and TSH.  Repeat 2D echo to reassess LVF.  Cannot increase carvedilol further and due to DCM in the past would avoid CCB.  Please find out if she is having any sx.

## 2018-11-01 ENCOUNTER — Other Ambulatory Visit: Payer: 59 | Admitting: *Deleted

## 2018-11-01 ENCOUNTER — Other Ambulatory Visit: Payer: Self-pay

## 2018-11-01 DIAGNOSIS — I428 Other cardiomyopathies: Secondary | ICD-10-CM

## 2018-11-01 DIAGNOSIS — I5022 Chronic systolic (congestive) heart failure: Secondary | ICD-10-CM

## 2018-11-02 LAB — BASIC METABOLIC PANEL
BUN/Creatinine Ratio: 10 (ref 9–23)
BUN: 9 mg/dL (ref 6–24)
CO2: 23 mmol/L (ref 20–29)
Calcium: 9.2 mg/dL (ref 8.7–10.2)
Chloride: 105 mmol/L (ref 96–106)
Creatinine, Ser: 0.94 mg/dL (ref 0.57–1.00)
GFR calc Af Amer: 77 mL/min/{1.73_m2} (ref >59)
GFR calc non Af Amer: 67 mL/min/{1.73_m2} (ref >59)
Glucose: 97 mg/dL (ref 65–99)
Potassium: 4.5 mmol/L (ref 3.5–5.2)
Sodium: 141 mmol/L (ref 134–144)

## 2018-11-02 LAB — TSH: TSH: 2.09 u[IU]/mL (ref 0.450–4.500)

## 2018-11-02 LAB — MAGNESIUM: Magnesium: 2 mg/dL (ref 1.6–2.3)

## 2018-11-05 ENCOUNTER — Encounter (HOSPITAL_COMMUNITY): Payer: Self-pay | Admitting: Cardiology

## 2018-11-12 ENCOUNTER — Telehealth: Payer: Self-pay

## 2018-11-12 ENCOUNTER — Ambulatory Visit (HOSPITAL_COMMUNITY): Payer: 59 | Attending: Cardiology

## 2018-11-12 ENCOUNTER — Other Ambulatory Visit: Payer: Self-pay

## 2018-11-12 DIAGNOSIS — I5022 Chronic systolic (congestive) heart failure: Secondary | ICD-10-CM | POA: Diagnosis not present

## 2018-11-12 DIAGNOSIS — I428 Other cardiomyopathies: Secondary | ICD-10-CM | POA: Insufficient documentation

## 2018-11-12 NOTE — Telephone Encounter (Signed)
-----   Message from Sueanne Margarita, MD sent at 11/12/2018  3:23 PM EDT ----- Echo showed moderately reduced LVF with EF 35-40% with increased stiffness of heart muscle and mildly leaky MV.  EF similar to last echo a year ago but MRI a year ago showed EF in low 50's.  She is completely asymptomatic so would not repeat MRI at this time and continue medical therapy

## 2018-11-12 NOTE — Telephone Encounter (Signed)
Notes recorded by Frederik Schmidt, RN on 11/12/2018 at 3:28 PM EDT  lpmtcb 9/15  ------

## 2018-11-13 ENCOUNTER — Telehealth: Payer: Self-pay

## 2018-11-13 NOTE — Telephone Encounter (Signed)
-----   Message from Sueanne Margarita, MD sent at 11/12/2018  3:23 PM EDT ----- Echo showed moderately reduced LVF with EF 35-40% with increased stiffness of heart muscle and mildly leaky MV.  EF similar to last echo a year ago but MRI a year ago showed EF in low 50's.  She is completely asymptomatic so would not repeat MRI at this time and continue medical therapy

## 2018-11-13 NOTE — Telephone Encounter (Signed)
Pt called the office returning a call from Legrand Como. Please return call to patient

## 2018-11-13 NOTE — Telephone Encounter (Signed)
The patient has been notified of the result and verbalized understanding.  All questions (if any) were answered. Frederik Schmidt, RN 11/13/2018 1:41 PM

## 2018-11-13 NOTE — Telephone Encounter (Signed)
Notes recorded by Frederik Schmidt, RN on 11/13/2018 at 9:17 AM EDT  lpmtcb 9/16  ------

## 2018-11-13 NOTE — Telephone Encounter (Signed)
Notes recorded by Frederik Schmidt, RN on 11/13/2018 at 12:11 PM EDT  Lpm and mailed results 9/16  ------

## 2018-11-26 ENCOUNTER — Other Ambulatory Visit: Payer: Self-pay | Admitting: Cardiology

## 2019-02-17 ENCOUNTER — Telehealth: Payer: Self-pay

## 2019-02-17 NOTE — Telephone Encounter (Signed)
**Note De-identified Marit Goodwill Obfuscation** -----  **Note De-Identified Raunak Antuna Obfuscation** Message from Ramond Dial, [facility] sent at 02/13/2019  2:46 PM EST ----- Regarding: FW: Delene Loll prior authorization  ----- Message ----- From: Delma Officer Sent: 02/13/2019   1:10 PM EST To: Leeroy Bock, RPH Subject: Delene Loll prior authorization                   Hi Megan,  I was told to send this to you.  We (billing dept) received a call from this patient.  She stated that her insurance is now requiring prior authorization for Entresto.  She left a phone number [phone] to call regarding this.  Thanks, [employer]

## 2019-02-17 NOTE — Telephone Encounter (Signed)
**Note De-Identified Terri [name] Obfuscation** I have started a Entresto PA through covermymeds. Key: [id number]

## 2019-02-26 ENCOUNTER — Telehealth: Payer: Self-pay | Admitting: Cardiology

## 2019-02-26 NOTE — Telephone Encounter (Signed)
Patient calling back to check on status of prior authorization for her Entresto.

## 2019-02-26 NOTE — Telephone Encounter (Signed)
Patient is calling to see if Dr. Radford Pax recommends for her to get the COVID vaccine. I let the patient know that Dr. Radford Pax was out of the office but I will forward to her and call back with Dr. Theodosia Blender advisement.

## 2019-02-26 NOTE — Telephone Encounter (Signed)
**Note De-Identified Terri [name] Obfuscation** The pts Entresto (New start) PA has been denied because it is for 2021 and the pts INS will not consider an approval until 02/28/2019.  After reviewing the pts chart I found that the pt has commercial ins so she can use a $10 Entresto co-pay card and that will prevent her from having to wait for a PA through her ins plan and will be less expensive for her. She is aware that she will need to activate the card and to call the phone number listed on the card if she needs assistance with activation. Per her request we have mailed a $10 Entresto co-pay card to her address.  Also, the pt states that she works for [employer] and has been offered the Covid-19 vaccination. She wants to know if receiving the vaccine would be ok from a cardiac stand point and is requesting Dr Landis Gandy opinion.  Please advise.

## 2019-02-26 NOTE — Telephone Encounter (Signed)
Patient calling for Dr. Theodosia Blender recommendation on whether she should get the COVID Vaccine.

## 2019-02-27 NOTE — Telephone Encounter (Signed)
Given her cardiac problems I highly recommend the COVID vaccine when available

## 2019-03-03 NOTE — Telephone Encounter (Signed)
Informed patient that Dr. Radford Pax highly recommends that she receive the COVID vaccine.

## 2019-03-10 ENCOUNTER — Other Ambulatory Visit: Payer: Self-pay | Admitting: Cardiology

## 2019-03-12 ENCOUNTER — Telehealth: Payer: Self-pay

## 2019-03-12 NOTE — Telephone Encounter (Signed)
**Note De-Identified Terri [name] Obfuscation** I started a Entresto PA through cover my meds. KeyTO:[id number]   See phone note from 02/17/2019. The pt was given a $10 Entresto Co-pay card which she uses to get her Entresto.

## 2019-03-13 NOTE — Telephone Encounter (Signed)
**Note De-Identified Gatlyn Lipari Obfuscation** Letter received from [facility] stating that they did approve the pts Entresto PA. Approval good until 03/11/2020 Reference#  [id number]  I have notified [place] of this approval.

## 2019-03-15 ENCOUNTER — Other Ambulatory Visit: Payer: Self-pay | Admitting: Cardiology

## 2019-03-17 ENCOUNTER — Other Ambulatory Visit: Payer: Self-pay | Admitting: Cardiology

## 2019-03-17 MED ORDER — FUROSEMIDE 20 MG PO TABS: 20.0000 mg | ORAL_TABLET | Freq: Every day | ORAL | 2 refills | Status: DC

## 2019-04-11 ENCOUNTER — Ambulatory Visit: Payer: 59 | Admitting: Cardiology

## 2019-04-14 ENCOUNTER — Ambulatory Visit: Payer: 59 | Admitting: Cardiology

## 2019-04-18 ENCOUNTER — Encounter (INDEPENDENT_AMBULATORY_CARE_PROVIDER_SITE_OTHER): Payer: Self-pay

## 2019-04-18 ENCOUNTER — Ambulatory Visit: Payer: 59 | Admitting: Cardiology

## 2019-04-18 ENCOUNTER — Other Ambulatory Visit: Payer: Self-pay

## 2019-04-18 ENCOUNTER — Encounter: Payer: Self-pay | Admitting: Cardiology

## 2019-04-18 VITALS — BP 122/62 | HR 70 | Ht 62.0 in | Wt 194.0 lb

## 2019-04-18 DIAGNOSIS — E7800 Pure hypercholesterolemia, unspecified: Secondary | ICD-10-CM | POA: Diagnosis not present

## 2019-04-18 DIAGNOSIS — I5022 Chronic systolic (congestive) heart failure: Secondary | ICD-10-CM | POA: Diagnosis not present

## 2019-04-18 DIAGNOSIS — I428 Other cardiomyopathies: Secondary | ICD-10-CM

## 2019-04-18 DIAGNOSIS — I10 Essential (primary) hypertension: Secondary | ICD-10-CM

## 2019-04-18 MED ORDER — FUROSEMIDE 20 MG PO TABS: 20.0000 mg | ORAL_TABLET | Freq: Every day | ORAL | 2 refills | Status: DC

## 2019-04-18 MED ORDER — SPIRONOLACTONE 25 MG PO TABS: 25.0000 mg | ORAL_TABLET | Freq: Every day | ORAL | 3 refills | Status: DC

## 2019-04-18 NOTE — Addendum Note (Signed)
Addended by: Antonieta Iba on: 04/18/2019 02:15 PM   Modules accepted: Orders

## 2019-04-18 NOTE — Patient Instructions (Signed)
Medication Instructions:  Your physician recommends that you continue on your current medications as directed. Please refer to the Current Medication list given to you today.  *If you need a refill on your cardiac medications before your next appointment, please call your pharmacy*  Lab Work: TODAY: BMET If you have labs (blood work) drawn today and your tests are completely normal, you will receive your results only by: Marland Kitchen MyChart Message (if you have MyChart) OR . A paper copy in the mail If you have any lab test that is abnormal or we need to change your treatment, we will call you to review the results.   Follow-Up: At [facility], you and your health needs are our priority.  As part of our continuing mission to provide you with exceptional heart care, we have created designated Provider Care Teams.  These Care Teams include your primary Cardiologist (physician) and Advanced Practice Providers (APPs -  Physician Assistants and Nurse Practitioners) who all work together to provide you with the care you need, when you need it.  Your next appointment:   1 year(s)  The format for your next appointment:   In Person  Provider:   Fransico Him, MD

## 2019-04-18 NOTE — Progress Notes (Signed)
Cardiology Office Note:    Date:  04/18/2019   ID:  Terri [name], DOB 1960/03/10, MRN [id number]  PCP:  Maury Dus, MD  Cardiologist:  Fransico Him, MD    Referring MD: Maury Dus, MD   Chief Complaint  Patient presents with  . Congestive Heart Failure  . Hypertension  . Hyperlipidemia  . Cardiomyopathy    History of Present Illness:    Terri [name] is a 59 y.o. female with a hx of  NICM (presumed idiopathic - normal cors 2010, neg cardiac CT [id number]), chronic systolic CHF, HTN, obesity, hyperlipidemia, LBBB. 2D echo10/11/17 showed decline in LVEF withseptal and apical akinesis, distal anterior wall and inferior wall hypokinesis, EF 30-35% (prev [id number]), normal diastolic parameters, mild MR, mildly dilated LA. Spironolactone was added in 11/2015 and we switched ramipril to Entresto at OV 11/2017and then increased to 49/51mg  BID at [place] 02/09/2016. Coronary CTA 02/03/16 showed calcium score of 0, no significant CAD.  He is here today for followup and is doing well.  He denies any chest pain or pressure, SOB, DOE, PND, orthopnea, LE edema, dizziness, palpitations or syncope. He is compliant with his meds and is tolerating meds with no SE.    Past Medical History:  Diagnosis Date  . Anemia   . Chronic systolic congestive heart failure ([place])   . Hypercholesteremia   . Hypertension   . Insomnia   . LBBB (left bundle branch block)   . Nonischemic cardiomyopathy ([place])    a. LVEF 40-45%, presumed idiopathic by echo 05/2012. b. Decline in EF to 30-35% in 2017 - neg cardiac CT for CAD at that time 01/2016. EF 50% by cardiac MRI 04/2017  . Obesity   . PONV (postoperative nausea and vomiting)     Past Surgical History:  Procedure Laterality Date  . ABDOMINAL HYSTERECTOMY    . CARDIAC CATHETERIZATION  2010   normal coronary arteries  . COLONOSCOPY    . fatty tumor removal Left    Axilla  . FEMORAL ARTERY STENT  06/2010   placement of stent in femoral artery for chronic LE  edema  . IR GENERIC HISTORICAL  02/03/2016   IR US GUIDE VASC ACCESS RIGHT 02/03/2016 Corrie Mckusick, DO [facility]  . IR GENERIC HISTORICAL  02/03/2016   IR RADIOLOGY PERIPHERAL GUIDED IV START 02/03/2016 Corrie Mckusick, DO [facility]  . LAPAROSCOPY N/A 07/19/2017   Procedure: DIAGNOSTIC LAPAROSCOPY WITH PARTIAL OMENTECTOMY;  Surgeon: Ileana Roup, MD;  Location: [facility];  Service: General;  Laterality: N/A;  . WISDOM TOOTH EXTRACTION      Current Medications: Current Meds  Medication Sig  . carvedilol (COREG) 25 MG tablet TAKE 1 TABLET BY MOUTH TWICE DAILY WITH A MEAL  . ENTRESTO 49-51 MG Take 1 tablet by mouth twice daily  . furosemide (LASIX) 20 MG tablet Take 1 tablet (20 mg total) by mouth daily.  Marland Kitchen spironolactone (ALDACTONE) 25 MG tablet Take 1 tablet (25 mg total) by mouth daily.     Allergies:   Patient has no known allergies.   Social History   Socioeconomic History  . Marital status: Widowed    Spouse name: Not on file  . Number of children: Not on file  . Years of education: Not on file  . Highest education level: Not on file  Occupational History  . Not on file  Tobacco Use  . Smoking status: Never Smoker  . Smokeless tobacco: Never Used  Substance and Sexual Activity  . Alcohol  use: No  . Drug use: No  . Sexual activity: Not on file  Other Topics Concern  . Not on file  Social History Narrative  . Not on file   Social Determinants of Health   Financial Resource Strain:   . Difficulty of Paying Living Expenses: Not on file  Food Insecurity:   . Worried About Charity fundraiser in the Last Year: Not on file  . Ran Out of Food in the Last Year: Not on file  Transportation Needs:   . Lack of Transportation (Medical): Not on file  . Lack of Transportation (Non-Medical): Not on file  Physical Activity:   . Days of Exercise per Week: Not on file  . Minutes of Exercise per Session: Not on file  Stress:   . Feeling of Stress : Not on file  Social  Connections:   . Frequency of Communication with Friends and Family: Not on file  . Frequency of Social Gatherings with Friends and Family: Not on file  . Attends Religious Services: Not on file  . Active Member of Clubs or Organizations: Not on file  . Attends Archivist Meetings: Not on file  . Marital Status: Not on file     Family History: The patient's family history includes CVA in her mother; Hypertension in her brother, brother, father, mother, and sister.  ROS:   Please see the history of present illness.    ROS  All other systems reviewed and negative.   EKGs/Labs/Other Studies Reviewed:    The following studies were reviewed today: Outside labs from PCP  EKG:  EKG is not ordered today.    Recent Labs: 11/01/2018: BUN 9; Creatinine, Ser 0.94; Magnesium 2.0; Potassium 4.5; Sodium 141; TSH 2.090   Recent Lipid Panel No results found for: CHOL, TRIG, HDL, CHOLHDL, VLDL, LDLCALC, LDLDIRECT  Physical Exam:    VS:  BP 122/62   Pulse 70   Ht 5\' 2"  (1.575 m)   Wt 194 lb (88 kg)   SpO2 99%   BMI 35.48 kg/m     Wt Readings from Last 3 Encounters:  04/18/19 194 lb (88 kg)  09/30/18 197 lb 12.8 oz (89.7 kg)  07/19/17 188 lb (85.3 kg)     GEN:  Well nourished, well developed in no acute distress HEENT: Normal NECK: No JVD; No carotid bruits LYMPHATICS: No lymphadenopathy CARDIAC: RRR, no murmurs, rubs, gallops RESPIRATORY:  Clear to auscultation without rales, wheezing or rhonchi  ABDOMEN: Soft, non-tender, non-distended MUSCULOSKELETAL:  No edema; No deformity  SKIN: Warm and dry NEUROLOGIC:  Alert and oriented x 3 PSYCHIATRIC:  Normal affect   ASSESSMENT:    1. Chronic systolic congestive heart failure, NYHA class 2 ([place])   2. Essential hypertension, benign   3. Nonischemic cardiomyopathy ([place])   4. Hypercholesteremia    PLAN:    In order of problems listed above:  1.  Chronic systolic CHF - NYHA class 2a -she appears euvolemic on exam  today -weight is stable and no LE edema or SOB -continue Carvedilol 25mg  BID, Lasix 20mg  daily, Spiro 25mg  daily and Entresto 49-51mg  BID -outside labs from PCP reviewed showing a Creatinine of 0.94and K+ 4.5 in Sept 2020  2.  HTN -BP controlled -continue Carvedilol, Entresto and Spiro  3.  NICM -2D echocardiogram 04/26/2017 showed moderately reduced LV function with EF 35-40% with septal apical and distal anterior wall hypokinesis -cardiac MRI done 05/21/2017 showed mildly dilated LV with marked paradoxical septal motion secondary  to left bundle branch block and EF 52%.  There was no late gadolinium enhancement.  -Likely the discrepancy in EF by echo was due to the paradoxical septal motion that made the EF appear reduced.  4.  HLD -followed by PCP -continue Crestor 10mg  daily  Medication Adjustments/Labs and Tests Ordered: Current medicines are reviewed at length with the patient today.  Concerns regarding medicines are outlined above.  No orders of the defined types were placed in this encounter.  No orders of the defined types were placed in this encounter.   Signed, Fransico Him, MD  04/18/2019 2:03 PM    [place]

## 2019-04-19 LAB — BASIC METABOLIC PANEL
BUN/Creatinine Ratio: 14 (ref 9–23)
BUN: 13 mg/dL (ref 6–24)
CO2: 24 mmol/L (ref 20–29)
Calcium: 9.3 mg/dL (ref 8.7–10.2)
Chloride: 105 mmol/L (ref 96–106)
Creatinine, Ser: 0.93 mg/dL (ref 0.57–1.00)
GFR calc Af Amer: 78 mL/min/{1.73_m2} (ref >59)
GFR calc non Af Amer: 68 mL/min/{1.73_m2} (ref >59)
Glucose: 84 mg/dL (ref 65–99)
Potassium: 4.6 mmol/L (ref 3.5–5.2)
Sodium: 140 mmol/L (ref 134–144)

## 2019-12-18 ENCOUNTER — Other Ambulatory Visit: Payer: Self-pay | Admitting: Cardiology

## 2020-03-25 ENCOUNTER — Other Ambulatory Visit: Payer: Self-pay | Admitting: Cardiology

## 2020-05-03 ENCOUNTER — Encounter: Payer: Self-pay | Admitting: Cardiology

## 2020-05-03 ENCOUNTER — Other Ambulatory Visit: Payer: Self-pay

## 2020-05-03 ENCOUNTER — Ambulatory Visit: Payer: 59 | Admitting: Cardiology

## 2020-05-03 VITALS — BP 142/90 | HR 78 | Ht 62.0 in | Wt 188.6 lb

## 2020-05-03 DIAGNOSIS — I10 Essential (primary) hypertension: Secondary | ICD-10-CM | POA: Diagnosis not present

## 2020-05-03 DIAGNOSIS — E7800 Pure hypercholesterolemia, unspecified: Secondary | ICD-10-CM | POA: Diagnosis not present

## 2020-05-03 DIAGNOSIS — I5022 Chronic systolic (congestive) heart failure: Secondary | ICD-10-CM

## 2020-05-03 DIAGNOSIS — I428 Other cardiomyopathies: Secondary | ICD-10-CM

## 2020-05-03 LAB — BASIC METABOLIC PANEL
BUN/Creatinine Ratio: 11 (ref 9–23)
BUN: 10 mg/dL (ref 6–24)
CO2: 21 mmol/L (ref 20–29)
Calcium: 9.1 mg/dL (ref 8.7–10.2)
Chloride: 103 mmol/L (ref 96–106)
Creatinine, Ser: 0.91 mg/dL (ref 0.57–1.00)
Glucose: 104 mg/dL — ABNORMAL HIGH (ref 65–99)
Potassium: 3.9 mmol/L (ref 3.5–5.2)
Sodium: 140 mmol/L (ref 134–144)
eGFR: 73 mL/min/{1.73_m2} (ref >59)

## 2020-05-03 MED ORDER — CARVEDILOL 25 MG PO TABS: 25.0000 mg | ORAL_TABLET | Freq: Two times a day (BID) | ORAL | 3 refills | Status: DC

## 2020-05-03 MED ORDER — ENTRESTO 49-51 MG PO TABS: 1.0000 | ORAL_TABLET | Freq: Two times a day (BID) | ORAL | 3 refills | Status: DC

## 2020-05-03 MED ORDER — FUROSEMIDE 20 MG PO TABS: 20.0000 mg | ORAL_TABLET | Freq: Every day | ORAL | 3 refills | Status: DC

## 2020-05-03 MED ORDER — SPIRONOLACTONE 25 MG PO TABS: 25.0000 mg | ORAL_TABLET | Freq: Every day | ORAL | 3 refills | Status: DC

## 2020-05-03 NOTE — Patient Instructions (Signed)
Medication Instructions:  Your physician recommends that you continue on your current medications as directed. Please refer to the Current Medication list given to you today.  *If you need a refill on your cardiac medications before your next appointment, please call your pharmacy*   Lab Work: TODAY: BMET If you have labs (blood work) drawn today and your tests are completely normal, you will receive your results only by: Marland Kitchen MyChart Message (if you have MyChart) OR . A paper copy in the mail If you have any lab test that is abnormal or we need to change your treatment, we will call you to review the results.   Follow-Up: At [facility], you and your health needs are our priority.  As part of our continuing mission to provide you with exceptional heart care, we have created designated Provider Care Teams.  These Care Teams include your primary Cardiologist (physician) and Advanced Practice Providers (APPs -  Physician Assistants and Nurse Practitioners) who all work together to provide you with the care you need, when you need it.  We recommend signing up for the patient portal called "MyChart".  Sign up information is provided on this After Visit Summary.  MyChart is used to connect with patients for Virtual Visits (Telemedicine).  Patients are able to view lab/test results, encounter notes, upcoming appointments, etc.  Non-urgent messages can be sent to your provider as well.   To learn more about what you can do with MyChart, go to [email or web address].    Your next appointment:   1 year(s)  The format for your next appointment:   In Person  Provider:   You may see Fransico Him, MD or one of the following Advanced Practice Providers on your designated Care Team:    Melina Copa, PA-C  Ermalinda Barrios, PA-C

## 2020-05-03 NOTE — Progress Notes (Signed)
Cardiology Office Note:    Date:  05/03/2020   ID:  [name], DOB [birth year], MRN [id number]  PCP:  Maury Dus, MD  Cardiologist:  Fransico Him, MD    Referring MD: Maury Dus, MD   Chief Complaint  Patient presents with  . Congestive Heart Failure  . Hypertension    History of Present Illness:    Terri [name] is a 60 y.o. female with a hx of  NICM (presumed idiopathic - normal cors 2010, neg cardiac CT 28/4132), chronic systolic CHF, HTN, obesity, hyperlipidemia, LBBB. 2D echo10/11/17 showed decline in LVEF withseptal and apical akinesis, distal anterior wall and inferior wall hypokinesis, EF 30-35% (prev 44-01%), normal diastolic parameters, mild MR, mildly dilated LA. Spironolactone was added in 11/2015 and we switched ramipril to Entresto at [facility] 11/2017and then increased to 49/51mg  BID at [place] 02/09/2016. Coronary CTA 02/03/16 showed calcium score of 0, no significant CAD. cardiac MRI done 05/21/2017 showed mildly dilated LV with marked paradoxical septal motion secondary to left bundle branch block and EF 52%.  There was no late gadolinium enhancement.   She is here today for followup and is doing well.  She denies any chest pain or pressure, SOB, DOE, PND, orthopnea, LE edema, dizziness, palpitations or syncope. She is compliant with her meds and is tolerating meds with no SE.    Past Medical History:  Diagnosis Date  . Anemia   . Chronic systolic congestive heart failure ([place])   . Hypercholesteremia   . Hypertension   . Insomnia   . LBBB (left bundle branch block)   . Nonischemic cardiomyopathy ([place])    a. LVEF 40-45%, presumed idiopathic by echo 05/2012. b. Decline in EF to 30-35% in 2017 - neg cardiac CT for CAD at that time 01/2016. EF 50% by cardiac MRI 04/2017  . Obesity   . PONV (postoperative nausea and vomiting)     Past Surgical History:  Procedure Laterality Date  . ABDOMINAL HYSTERECTOMY    . CARDIAC CATHETERIZATION  2010   normal coronary  arteries  . COLONOSCOPY    . fatty tumor removal Left    Axilla  . FEMORAL ARTERY STENT  06/2010   placement of stent in femoral artery for chronic LE edema  . IR GENERIC HISTORICAL  02/03/2016   IR US GUIDE VASC ACCESS RIGHT 02/03/2016 Corrie Mckusick, DO [facility]  . IR GENERIC HISTORICAL  02/03/2016   IR RADIOLOGY PERIPHERAL GUIDED IV START 02/03/2016 Corrie Mckusick, DO [facility]  . LAPAROSCOPY N/A 07/19/2017   Procedure: DIAGNOSTIC LAPAROSCOPY WITH PARTIAL OMENTECTOMY;  Surgeon: Ileana Roup, MD;  Location: [facility];  Service: General;  Laterality: N/A;  . WISDOM TOOTH EXTRACTION      Current Medications: Current Meds  Medication Sig  . [DISCONTINUED] carvedilol (COREG) 25 MG tablet TAKE 1 TABLET BY MOUTH TWICE DAILY WITH A MEAL  . [DISCONTINUED] furosemide (LASIX) 20 MG tablet Take 1 tablet (20 mg total) by mouth daily. Please keep upcoming appt in March 2022 with Dr. Radford Pax before anymore refills. Thank you  . [DISCONTINUED] sacubitril-valsartan (ENTRESTO) 49-51 MG Take 1 tablet by mouth 2 (two) times daily. Please keep upcoming appt with Dr. Radford Pax in March 2022 before anymore refills. Thank you  . [DISCONTINUED] spironolactone (ALDACTONE) 25 MG tablet Take 1 tablet (25 mg total) by mouth daily.     Allergies:   Patient has no known allergies.   Social History   Socioeconomic History  . Marital status: Widowed  Spouse name: Not on file  . Number of children: Not on file  . Years of education: Not on file  . Highest education level: Not on file  Occupational History  . Not on file  Tobacco Use  . Smoking status: Never Smoker  . Smokeless tobacco: Never Used  Vaping Use  . Vaping Use: Never used  Substance and Sexual Activity  . Alcohol use: No  . Drug use: No  . Sexual activity: Not on file  Other Topics Concern  . Not on file  Social History Narrative  . Not on file   Social Determinants of Health   Financial Resource Strain: Not on file  Food  Insecurity: Not on file  Transportation Needs: Not on file  Physical Activity: Not on file  Stress: Not on file  Social Connections: Not on file     Family History: The patient's family history includes CVA in her mother; Hypertension in her brother, brother, father, mother, and sister.  ROS:   Please see the history of present illness.    ROS  All other systems reviewed and negative.   EKGs/Labs/Other Studies Reviewed:    The following studies were reviewed today: Outside labs from PCP  EKG:  EKG is not ordered today.    Recent Labs: No results found for requested labs within last 8760 hours.   Recent Lipid Panel No results found for: CHOL, TRIG, HDL, CHOLHDL, VLDL, LDLCALC, LDLDIRECT  Physical Exam:    VS:  BP (!) 142/90   Pulse 78   Ht 5\' 2"  (1.575 m)   Wt 188 lb 9.6 oz (85.5 kg)   BMI 34.50 kg/m     Wt Readings from Last 3 Encounters:  05/03/20 188 lb 9.6 oz (85.5 kg)  04/18/19 194 lb (88 kg)  09/30/18 197 lb 12.8 oz (89.7 kg)    GEN: Well nourished, well developed in no acute distress HEENT: Normal NECK: No JVD; No carotid bruits LYMPHATICS: No lymphadenopathy CARDIAC:RRR, no murmurs, rubs, gallops RESPIRATORY:  Clear to auscultation without rales, wheezing or rhonchi  ABDOMEN: Soft, non-tender, non-distended MUSCULOSKELETAL:  No edema; No deformity  SKIN: Warm and dry NEUROLOGIC:  Alert and oriented x 3 PSYCHIATRIC:  Normal affect     ASSESSMENT:    1. Chronic systolic congestive heart failure, NYHA class 2 ([place])   2. Essential hypertension, benign   3. Nonischemic cardiomyopathy ([place])   4. Hypercholesteremia    PLAN:    In order of problems listed above:  1.  Chronic systolic CHF - NYHA class 2a -she does not appear volume overloaded on exam today -weight remains stable and she had not had any significant problems with DOE or LE edema -continue Carvedilol 25mg  BID, Lasix 20mg  daily, Spiro 25mg  daily and Entresto 49-51mg  BID -check BMET  today  2.  HTN -BP is borderline controlled on exam today but she has not taken her meds yet -continue Carvedilol, Entresto and Spiro  3.  NICM -2D echocardiogram 04/26/2017 showed moderately reduced LV function with EF 35-40% with septal apical and distal anterior wall hypokinesis -cardiac MRI done 05/21/2017 showed mildly dilated LV with marked paradoxical septal motion secondary to left bundle branch block and EF 52%.  There was no late gadolinium enhancement.  -Likely the discrepancy in EF by echo was due to the paradoxical septal motion that made the EF appear reduced.  4.  HLD -followed by PCP -continue Crestor 10mg  daily  Medication Adjustments/Labs and Tests Ordered: Current medicines are reviewed at  length with the patient today.  Concerns regarding medicines are outlined above.  Orders Placed This Encounter  Procedures  . EKG 12-Lead   Meds ordered this encounter  Medications  . carvedilol (COREG) 25 MG tablet    Sig: Take 1 tablet (25 mg total) by mouth 2 (two) times daily with a meal.    Dispense:  180 tablet    Refill:  3  . furosemide (LASIX) 20 MG tablet    Sig: Take 1 tablet (20 mg total) by mouth daily. Please keep upcoming appt in March 2022 with Dr. Radford Pax before anymore refills. Thank you    Dispense:  90 tablet    Refill:  3    Pt must keep upcoming appt in March 2022 with Dr. Radford Pax before anymore refills. Thank you  . sacubitril-valsartan (ENTRESTO) 49-51 MG    Sig: Take 1 tablet by mouth 2 (two) times daily. Please keep upcoming appt with Dr. Radford Pax in March 2022 before anymore refills. Thank you    Dispense:  180 tablet    Refill:  3    Pt must keep upcoming appt in March 2022 with Dr. Radford Pax before anymore refills. Thank you  . spironolactone (ALDACTONE) 25 MG tablet    Sig: Take 1 tablet (25 mg total) by mouth daily.    Dispense:  90 tablet    Refill:  3    Signed, Fransico Him, MD  05/03/2020 8:37 AM    [facility]

## 2020-05-03 NOTE — Addendum Note (Signed)
Addended by: Antonieta Iba on: 05/03/2020 08:43 AM   Modules accepted: Orders

## 2021-06-22 NOTE — Progress Notes (Signed)
?Cardiology Office Note:   ? ?Date:  06/24/2021  ? ?ID:  Terri [name], DOB [birth year], MRN [id number] ? ?PCP:  Glendon Axe, MD ?  ?[facility] Providers ?Cardiologist:  Fransico Him, MD    ? ?Referring MD: Maury Dus, MD  ? ?Chief Complaint: follow-up LBBB, hypertension ? ?History of Present Illness:   ? ?Terri [name] is a 61 y.o. female with a hx of chronic HFrEF, hypertension, LBBB, NICM (presumed idiopathic - normal cors 2010, neg cardiac CT 01/2016), obesity, and hyperlipidemia. ? ?Echo 12/08/15 showed decline in LVEF with septal and apical akinesis, distal anterior wall and inferior wall hypokinesis, EF 30 to 35% (prev 61-95%), normal diastolic parameters, mild MR, mildly dilated LA, placed on GDMT for HFrEF.  Coronary CTA 02/03/16 showed calcium score of 0, no significant CAD.  Cardiac MRI 05/21/2017 showed mildly dilated LV with marked paradoxical septal motion 2/2 LBBB and EF 52%, no late gadolinium enhancement.  Discrepancy in EF by echo was likely due to paradoxical septal motion that made EF appear reduced.  ? ?She was last seen in our office by Dr. Radford Pax on 05/03/2020 at which time no changes were made to medical therapy and 1 year follow-up was recommended. ? ?Today, she is here alone for follow-up.  She reports she is feeling well with the exception of not sleeping well due to hot flashes. Gyn will not give her anything "because of my heart."  Advised that she could have GYN contact us as I do not see any indication for withholding treatment of menopause. Was previously having palpitations which have resolved.  Continues to work full-time, but is not exercising on a consistent basis. She denies chest pain, shortness of breath, lower extremity edema, fatigue, palpitations, melena, hematuria, hemoptysis, diaphoresis, weakness, presyncope, syncope, orthopnea, and PND. ? ?Past Medical History:  ?Diagnosis Date  ? Anemia   ? Chronic systolic congestive heart failure ([place])   ? Hypercholesteremia   ?  Hypertension   ? Insomnia   ? LBBB (left bundle branch block)   ? Nonischemic cardiomyopathy ([place])   ? a. LVEF 40-45%, presumed idiopathic by echo 05/2012. b. Decline in EF to 30-35% in 2017 - neg cardiac CT for CAD at that time 01/2016. EF 50% by cardiac MRI 04/2017  ? Obesity   ? PONV (postoperative nausea and vomiting)   ? ? ?Past Surgical History:  ?Procedure Laterality Date  ? ABDOMINAL HYSTERECTOMY    ? CARDIAC CATHETERIZATION  2010  ? normal coronary arteries  ? COLONOSCOPY    ? fatty tumor removal Left   ? Axilla  ? FEMORAL ARTERY STENT  06/2010  ? placement of stent in femoral artery for chronic LE edema  ? IR GENERIC HISTORICAL  02/03/2016  ? IR US GUIDE VASC ACCESS RIGHT 02/03/2016 Terri Mckusick, DO [facility]  ? IR GENERIC HISTORICAL  02/03/2016  ? IR RADIOLOGY PERIPHERAL GUIDED IV START 02/03/2016 Terri Mckusick, DO [facility]  ? LAPAROSCOPY N/A 07/19/2017  ? Procedure: DIAGNOSTIC LAPAROSCOPY WITH PARTIAL OMENTECTOMY;  Surgeon: Ileana Roup, MD;  Location: [facility];  Service: General;  Laterality: N/A;  ? WISDOM TOOTH EXTRACTION    ? ? ?Current Medications: ?Current Meds  ?Medication Sig  ? ezetimibe (ZETIA) 10 MG tablet Take 10 mg by mouth daily.  ? [DISCONTINUED] carvedilol (COREG) 25 MG tablet Take 1 tablet (25 mg total) by mouth 2 (two) times daily with a meal.  ? [DISCONTINUED] furosemide (LASIX) 20 MG tablet Take 1 tablet (20 mg  total) by mouth daily. Please keep upcoming appt in March 2022 with Dr. Radford Pax before anymore refills. Thank you  ? [DISCONTINUED] sacubitril-valsartan (ENTRESTO) 49-51 MG Take 1 tablet by mouth 2 (two) times daily. Please keep upcoming appt with Dr. Radford Pax in March 2022 before anymore refills. Thank you  ? [DISCONTINUED] spironolactone (ALDACTONE) 25 MG tablet Take 1 tablet (25 mg total) by mouth daily.  ?  ? ?Allergies:   Patient has no known allergies.  ? ?Social History  ? ?Socioeconomic History  ? Marital status: Widowed  ?  Spouse name: Not on file  ? Number of  children: Not on file  ? Years of education: Not on file  ? Highest education level: Not on file  ?Occupational History  ? Not on file  ?Tobacco Use  ? Smoking status: Never  ? Smokeless tobacco: Never  ?Vaping Use  ? Vaping Use: Never used  ?Substance and Sexual Activity  ? Alcohol use: No  ? Drug use: No  ? Sexual activity: Not on file  ?Other Topics Concern  ? Not on file  ?Social History Narrative  ? Not on file  ? ?Social Determinants of Health  ? ?Financial Resource Strain: Not on file  ?Food Insecurity: Not on file  ?Transportation Needs: Not on file  ?Physical Activity: Not on file  ?Stress: Not on file  ?Social Connections: Not on file  ?  ? ?Family History: ?The patient's family history includes CVA in her mother; Hypertension in her brother, brother, father, mother, and sister. ? ?ROS:   ?Please see the history of present illness.  All other systems reviewed and are negative. ? ?Labs/Other Studies Reviewed:   ? ?The following studies were reviewed today: ? ?Echo 10/2018 ? ?Left Ventricle: The left ventricle has moderately reduced systolic  ?function, with an ejection fraction of 35-40%. The cavity size was mildly  ?dilated. There is no increase in left ventricular wall thickness. Left  ?ventricular diastolic Doppler parameters  ?are consistent with impaired relaxation. Elevated mean left atrial  ?pressure There is abnormal (paradoxical) septal motion, consistent with  ?left bundle branch block.  ?Right Ventricle: The right ventricle has normal systolic function. The  ?cavity was normal.  ?Left Atrium: Left atrial size was normal in size.  ?Right Atrium: Right atrial size was normal in size. Right atrial pressure  ?is estimated at 3 mmHg.  ?Interatrial Septum: No atrial level shunt detected by color flow Doppler.  ?Pericardium: There is no evidence of pericardial effusion.  ?Mitral Valve: The mitral valve is abnormal. Mild thickening of the mitral  ?valve leaflet. Mitral valve regurgitation is mild by color  flow Doppler.  ?Tricuspid Valve: The tricuspid valve is grossly normal. Tricuspid valve  ?regurgitation was not visualized by color flow Doppler.  ?Aortic Valve: The aortic valve is tricuspid Aortic valve regurgitation was  ?not visualized by color flow Doppler. There is No stenosis of the aortic  ?valve.  ?Pulmonic Valve: The pulmonic valve was normal in structure. Pulmonic valve  ?regurgitation is trivial by color flow Doppler.  ?Aorta: The aorta is normal unless otherwise noted.  ?Venous: The inferior vena cava is normal in size with greater than 50%  ?respiratory variability.  ? ?Cardiac monitor 10/2018 ? ?Sinus Bradycardia, Normal Sinus rhythm, sinus tachycardia. The average heart rate was 77bpm. The heart rate ranged from 55-120bpm. ?Occasional PVCs and ventricular couplets. PVC load < 1%. ? ? ?Cardiac MRI 04/2017 ? ?1. Mildly dilated left ventricle with mild basal septal hypertrophy ?and mildly decreased  systolic function (LVEF = 52% ). There is very ?pronounced paradoxical septal motion most likely secondary to LBBB. ?  ?There is no late gadolinium enhancement in the left ventricular ?myocardium. ?  ?Improvement of LVEF when compared to the prior echocardiogram in ?February 2019. ?  ?2. Normal right ventricular size, thickness and systolic function ?(LVEF = 61 %). There are no regional wall motion abnormalities. ?  ?3. Mild mitral regurgitation. ? ?Coronary CTA 01/2016 ? ?1. Coronary calcium score of 0. This was 0 percentile for age and ?sex matched control. ?  ?2. Normal coronary origin with right dominance. ?  ?3. No evidence for CAD. ?  ?The findings are consistent with non-ischemic cardiomyopathy. ?  ? ?Recent Labs: ?No results found for requested labs within last 8760 hours.  ?Recent Lipid Panel ?No results found for: CHOL, TRIG, HDL, CHOLHDL, VLDL, LDLCALC, LDLDIRECT ? ? ?Risk Assessment/Calculations:   ?  ? ?Physical Exam:   ? ?VS:  BP 136/70   Pulse 66   Ht '5\' 2"'$  (1.575 m)   Wt 182 lb (82.6 kg)    SpO2 100%   BMI 33.29 kg/m?    ? ?Wt Readings from Last 3 Encounters:  ?06/24/21 182 lb (82.6 kg)  ?05/03/20 188 lb 9.6 oz (85.5 kg)  ?04/18/19 194 lb (88 kg)  ?  ? ?GEN:  Well nourished, well developed in no

## 2021-06-24 ENCOUNTER — Ambulatory Visit: Payer: 59 | Admitting: Nurse Practitioner

## 2021-06-24 ENCOUNTER — Telehealth: Payer: Self-pay | Admitting: *Deleted

## 2021-06-24 ENCOUNTER — Encounter: Payer: Self-pay | Admitting: Nurse Practitioner

## 2021-06-24 VITALS — BP 136/70 | HR 66 | Ht 62.0 in | Wt 182.0 lb

## 2021-06-24 DIAGNOSIS — I340 Nonrheumatic mitral (valve) insufficiency: Secondary | ICD-10-CM

## 2021-06-24 DIAGNOSIS — I10 Essential (primary) hypertension: Secondary | ICD-10-CM

## 2021-06-24 DIAGNOSIS — I5022 Chronic systolic (congestive) heart failure: Secondary | ICD-10-CM | POA: Diagnosis not present

## 2021-06-24 DIAGNOSIS — E7800 Pure hypercholesterolemia, unspecified: Secondary | ICD-10-CM

## 2021-06-24 DIAGNOSIS — I447 Left bundle-branch block, unspecified: Secondary | ICD-10-CM

## 2021-06-24 DIAGNOSIS — I428 Other cardiomyopathies: Secondary | ICD-10-CM

## 2021-06-24 LAB — COMPREHENSIVE METABOLIC PANEL
ALT: 13 IU/L (ref 0–32)
AST: 14 IU/L (ref 0–40)
Albumin/Globulin Ratio: 1.5 (ref 1.2–2.2)
Albumin: 3.8 g/dL (ref 3.8–4.8)
Alkaline Phosphatase: 108 IU/L (ref 44–121)
BUN/Creatinine Ratio: 14 (ref 12–28)
BUN: 13 mg/dL (ref 8–27)
Bilirubin Total: 0.2 mg/dL (ref 0.0–1.2)
CO2: 26 mmol/L (ref 20–29)
Calcium: 9.2 mg/dL (ref 8.7–10.3)
Chloride: 103 mmol/L (ref 96–106)
Creatinine, Ser: 0.93 mg/dL (ref 0.57–1.00)
Globulin, Total: 2.5 g/dL (ref 1.5–4.5)
Glucose: 98 mg/dL (ref 70–99)
Potassium: 4.2 mmol/L (ref 3.5–5.2)
Sodium: 141 mmol/L (ref 134–144)
Total Protein: 6.3 g/dL (ref 6.0–8.5)
eGFR: 70 mL/min/{1.73_m2} (ref >59)

## 2021-06-24 MED ORDER — CARVEDILOL 25 MG PO TABS: 25.0000 mg | ORAL_TABLET | Freq: Two times a day (BID) | ORAL | 11 refills | Status: DC

## 2021-06-24 MED ORDER — SPIRONOLACTONE 25 MG PO TABS: 25.0000 mg | ORAL_TABLET | Freq: Every day | ORAL | 11 refills | Status: DC

## 2021-06-24 MED ORDER — FUROSEMIDE 20 MG PO TABS
20.0000 mg | ORAL_TABLET | Freq: Every day | ORAL | 11 refills | Status: DC
Start: 2021-06-24 — End: 2022-07-13

## 2021-06-24 MED ORDER — ENTRESTO 49-51 MG PO TABS: 1.0000 | ORAL_TABLET | Freq: Two times a day (BID) | ORAL | 11 refills | Status: DC

## 2021-06-24 NOTE — Telephone Encounter (Signed)
S/w pt per Christen Bame, NP pt was seen if office today and needed a echo for MVR.  Orders placed in system and linked, appt made.  Billing process was discussed,  ?

## 2021-06-24 NOTE — Patient Instructions (Addendum)
Medication Instructions:  ? ?Your physician recommends that you continue on your current medications as directed. Please refer to the Current Medication list given to you today. ?**You may try magnesium L threonate (neuro mag) or magnesium taurate one pill at bedtime ? ? ?*If you need a refill on your cardiac medications before your next appointment, please call your pharmacy* ? ?Lab Work: ? ?TODAY!!!! CMET ? ?If you have labs (blood work) drawn today and your tests are completely normal, you will receive your results only by: ?MyChart Message (if you have MyChart) OR ?A paper copy in the mail ?If you have any lab test that is abnormal or we need to change your treatment, we will call you to review the results. ? ?Follow-Up: ?At [facility], you and your health needs are our priority.  As part of our continuing mission to provide you with exceptional heart care, we have created designated Provider Care Teams.  These Care Teams include your primary Cardiologist (physician) and Advanced Practice Providers (APPs -  Physician Assistants and Nurse Practitioners) who all work together to provide you with the care you need, when you need it. ? ?We recommend signing up for the patient portal called "MyChart".  Sign up information is provided on this After Visit Summary.  MyChart is used to connect with patients for Virtual Visits (Telemedicine).  Patients are able to view lab/test results, encounter notes, upcoming appointments, etc.  Non-urgent messages can be sent to your provider as well.   ?To learn more about what you can do with MyChart, go to [email or web address].   ? ?Your next appointment:   ?1 year(s) ? ?The format for your next appointment:   ?In Person ? ?Provider:   ?Fransico Him, MD   ? ? ?Other Instructions ? ?FAX # IS [phone] ATTENTION: DANIELLE ? ?Exercise recommendations: ?Goal of exercising for at least 30 minutes a day, at least 5 times per week.  Please exercise to a moderate exertion.  This  means that while exercising it is difficult to speak in full sentences, however you are not so short of breath that you feel you must stop, and not so comfortable that you can carry on a full conversation.  Exertion level should be approximately a 5/10, if 10 is the most exertion you can perform. ? ?Diet recommendations: ?Recommend a heart healthy diet such as the Mediterranean diet.  This diet consists of plant based foods, healthy fats, lean meats, olive oil.  It suggests limiting the intake of simple carbohydrates such as white breads, pastries, and pastas.  It also limits the amount of red meat, wine, and dairy products such as cheese that one should consume on a daily basis.  ? ?HOW TO TAKE YOUR BLOOD PRESSURE: ?Rest 5 minutes before taking your blood pressure. ? Don?t smoke or drink caffeinated beverages for at least 30 minutes before. ?Take your blood pressure before (not after) you eat. ?Sit comfortably with your back supported and both feet on the floor (don?t cross your legs). ?Elevate your arm to heart level on a table or a desk. ?Use the proper sized cuff. It should fit smoothly and snugly around your bare upper arm. There should be enough room to slip a fingertip under the cuff. The bottom edge of the cuff should be 1 inch above the crease of the elbow.  ? ?Mediterranean Diet ?A Mediterranean diet refers to food and lifestyle choices that are based on the traditions of countries located on the [employer]. It focuses  on eating more fruits, vegetables, whole grains, beans, nuts, seeds, and heart-healthy fats, and eating less dairy, meat, eggs, and processed foods with added sugar, salt, and fat. This way of eating has been shown to help prevent certain conditions and improve outcomes for people who have chronic diseases, like kidney disease and heart disease. ?What are tips for following this plan? ?Reading food labels ?Check the serving size of packaged foods. For foods such as rice and pasta, the  serving size refers to the amount of cooked product, not dry. ?Check the total fat in packaged foods. Avoid foods that have saturated fat or trans fats. ?Check the ingredient list for added sugars, such as corn syrup. ?Shopping ? ?Buy a variety of foods that offer a balanced diet, including: ?Fresh fruits and vegetables (produce). ?Grains, beans, nuts, and seeds. Some of these may be available in unpackaged forms or large amounts (in bulk). ?Fresh seafood. ?Poultry and eggs. ?Low-fat dairy products. ?Buy whole ingredients instead of prepackaged foods. ?Buy fresh fruits and vegetables in-season from local farmers markets. ?Buy plain frozen fruits and vegetables. ?If you do not have access to quality fresh seafood, buy precooked frozen shrimp or canned fish, such as tuna, salmon, or sardines. ?Stock your pantry so you always have certain foods on hand, such as olive oil, canned tuna, canned tomatoes, rice, pasta, and beans. ?Cooking ?Cook foods with extra-virgin olive oil instead of using butter or other vegetable oils. ?Have meat as a side dish, and have vegetables or grains as your main dish. This means having meat in small portions or adding small amounts of meat to foods like pasta or stew. ?Use beans or vegetables instead of meat in common dishes like chili or lasagna. ?Experiment with different cooking methods. Try roasting, broiling, steaming, and saut?ing vegetables. ?Add frozen vegetables to soups, stews, pasta, or rice. ?Add nuts or seeds for added healthy fats and plant protein at each meal. You can add these to yogurt, salads, or vegetable dishes. ?Marinate fish or vegetables using olive oil, lemon juice, garlic, and fresh herbs. ?Meal planning ?Plan to eat one vegetarian meal one day each week. Try to work up to two vegetarian meals, if possible. ?Eat seafood two or more times a week. ?Have healthy snacks readily available, such as: ?Vegetable sticks with hummus. ?[place] yogurt. ?Fruit and nut trail  mix. ?Eat balanced meals throughout the week. This includes: ?Fruit: 2-3 servings a day. ?Vegetables: 4-5 servings a day. ?Low-fat dairy: 2 servings a day. ?Fish, poultry, or lean meat: 1 serving a day. ?Beans and legumes: 2 or more servings a week. ?Nuts and seeds: 1-2 servings a day. ?Whole grains: 6-8 servings a day. ?Extra-virgin olive oil: 3-4 servings a day. ?Limit red meat and sweets to only a few servings a month. ?Lifestyle ? ?Cook and eat meals together with your family, when possible. ?Drink enough fluid to keep your urine pale yellow. ?Be physically active every day. This includes: ?Aerobic exercise like running or swimming. ?Leisure activities like gardening, walking, or housework. ?Get 7-8 hours of sleep each night. ?If recommended by your health care provider, drink red wine in moderation. This means 1 glass a day for nonpregnant women and 2 glasses a day for men. A glass of wine equals 5 oz (150 mL). ?What foods should I eat? ?Fruits ?Apples. Apricots. Avocado. Berries. Bananas. Cherries. Dates. Figs. Grapes. Lemons. Melon. Oranges. Peaches. Plums. Pomegranate. ?Vegetables ?Artichokes. Beets. Broccoli. Cabbage. Carrots. Eggplant. Green beans. Chard. Kale. Spinach. Onions. Leeks. Peas. Squash. Tomatoes.  Peppers. Radishes. ?Grains ?Whole-grain pasta. Brown rice. Bulgur wheat. Polenta. Couscous. Whole-wheat bread. [place]. ?Meats and other proteins ?Beans. Almonds. Sunflower seeds. Pine nuts. Peanuts. [place]. Salmon. Scallops. Shrimp. [place]. Tilapia. Clams. Oysters. Eggs. Poultry without skin. ?Dairy ?Low-fat milk. Cheese. Greek yogurt. ?Fats and oils ?Extra-virgin olive oil. Avocado oil. Grapeseed oil. ?Beverages ?Water. Red wine. Herbal tea. ?Sweets and desserts ?Greek yogurt with honey. Baked apples. Poached pears. Trail mix. ?Seasonings and condiments ?Basil. Cilantro. Coriander. Cumin. Mint. Parsley. Sage. Rosemary. Tarragon. Garlic. Oregano. Thyme. Pepper. Balsamic vinegar. Tahini. Hummus.  Tomato sauce. Olives. Mushrooms. ?The items listed above may not be a complete list of foods and beverages you can eat. Contact a dietitian for more information. ?What foods should I limit? ?This is a list of

## 2021-07-11 ENCOUNTER — Other Ambulatory Visit (HOSPITAL_COMMUNITY): Payer: 59

## 2021-07-19 ENCOUNTER — Ambulatory Visit (HOSPITAL_COMMUNITY): Payer: 59 | Attending: Cardiology

## 2021-07-19 DIAGNOSIS — I428 Other cardiomyopathies: Secondary | ICD-10-CM

## 2021-07-19 DIAGNOSIS — I340 Nonrheumatic mitral (valve) insufficiency: Secondary | ICD-10-CM | POA: Diagnosis not present

## 2021-07-19 DIAGNOSIS — I5022 Chronic systolic (congestive) heart failure: Secondary | ICD-10-CM

## 2021-07-19 LAB — ECHOCARDIOGRAM COMPLETE
Area-P 1/2: 3.99 cm2
S' Lateral: 3.4 cm

## 2021-07-21 ENCOUNTER — Telehealth: Payer: Self-pay | Admitting: Nurse Practitioner

## 2021-07-21 NOTE — Telephone Encounter (Signed)
Patient returning call for echo results. 

## 2021-07-22 ENCOUNTER — Telehealth: Payer: Self-pay | Admitting: Nurse Practitioner

## 2021-07-22 NOTE — Telephone Encounter (Signed)
Patient returned call regarding Echo results. Transferred to [place].

## 2022-01-22 NOTE — Progress Notes (Signed)
Cardiology Office Note:    Date:  01/23/2022   ID:  [name], DOB 1961-02-14, MRN [id number]  PCP:  Glendon Axe, MD  Cardiologist:  Fransico Him, MD    Referring MD: Glendon Axe, MD   Chief Complaint  Patient presents with   Congestive Heart Failure   Hypertension   Cardiomyopathy    History of Present Illness:    Terri [name] is a 61 y.o. female with a hx of  NICM (presumed idiopathic - normal cors 2010, neg cardiac CT 28/1188), chronic systolic CHF, HTN, obesity, hyperlipidemia, LBBB.   2D echo 12/08/15 showed decline in LVEF with septal and apical akinesis, distal anterior wall and inferior wall hypokinesis, EF 30-35% (prev 67-73%), normal diastolic parameters, mild MR, mildly dilated LA. Spironolactone was added in 11/2015 and we switched ramipril to Entresto at [place] 12/2015 and then increased to 49/'51mg'$  BID at [place] 02/09/2016.  Coronary CTA 02/03/16 showed calcium score of 0, no significant CAD. cardiac MRI done 05/21/2017 showed mildly dilated LV with marked paradoxical septal motion secondary to left bundle branch block and EF 52%.  There was no late gadolinium enhancement.   Seen by Christen Bame, NP 06/24/21 and was doing well.  2D echo done 06/2021 showed EF 35-40% with global HK, moderate MR.  SHe is here today for followup and is doing well.  She denies any chest pain or pressure, SOB, DOE, PND, orthopnea, LE edema, dizziness, palpitations or syncope. She is compliant with her meds and is tolerating meds with no SE.    Past Medical History:  Diagnosis Date   Anemia    Chronic systolic congestive heart failure (HCC)    Hypercholesteremia    Hypertension    Insomnia    LBBB (left bundle branch block)    Nonischemic cardiomyopathy ([place])    a. LVEF 40-45%, presumed idiopathic by echo 05/2012. b. Decline in EF to 30-35% in 2017 - neg cardiac CT for CAD at that time 01/2016. EF 50% by cardiac MRI 04/2017   Obesity    PONV (postoperative nausea and vomiting)      Past Surgical History:  Procedure Laterality Date   ABDOMINAL HYSTERECTOMY     CARDIAC CATHETERIZATION  2010   normal coronary arteries   COLONOSCOPY     fatty tumor removal Left    Axilla   FEMORAL ARTERY STENT  06/2010   placement of stent in femoral artery for chronic LE edema   IR GENERIC HISTORICAL  02/03/2016   IR US GUIDE VASC ACCESS RIGHT 02/03/2016 Corrie Mckusick, DO [facility]   IR GENERIC HISTORICAL  02/03/2016   IR RADIOLOGY PERIPHERAL GUIDED IV START 02/03/2016 Corrie Mckusick, DO [facility]   LAPAROSCOPY N/A 07/19/2017   Procedure: DIAGNOSTIC LAPAROSCOPY WITH PARTIAL OMENTECTOMY;  Surgeon: Ileana Roup, MD;  Location: [facility];  Service: General;  Laterality: N/A;   WISDOM TOOTH EXTRACTION      Current Medications: Current Meds  Medication Sig   carvedilol (COREG) 25 MG tablet Take 1 tablet (25 mg total) by mouth 2 (two) times daily with a meal.   ezetimibe (ZETIA) 10 MG tablet Take 10 mg by mouth daily.   furosemide (LASIX) 20 MG tablet Take 1 tablet (20 mg total) by mouth daily. Please keep upcoming appt in March 2022 with Dr. Radford Pax before anymore refills. Thank you   sacubitril-valsartan (ENTRESTO) 49-51 MG Take 1 tablet by mouth 2 (two) times daily.   spironolactone (ALDACTONE) 25 MG tablet Take 1 tablet (25  mg total) by mouth daily.     Allergies:   Patient has no known allergies.   Social History   Socioeconomic History   Marital status: Widowed    Spouse name: Not on file   Number of children: Not on file   Years of education: Not on file   Highest education level: Not on file  Occupational History   Not on file  Tobacco Use   Smoking status: Never   Smokeless tobacco: Never  Vaping Use   Vaping Use: Never used  Substance and Sexual Activity   Alcohol use: No   Drug use: No   Sexual activity: Not on file  Other Topics Concern   Not on file  Social History Narrative   Not on file   Social Determinants of Health   Financial Resource  Strain: Not on file  Food Insecurity: Not on file  Transportation Needs: Not on file  Physical Activity: Not on file  Stress: Not on file  Social Connections: Not on file     Family History: The patient's family history includes CVA in her mother; Hypertension in her brother, brother, father, mother, and sister.  ROS:   Please see the history of present illness.    ROS  All other systems reviewed and negative.   EKGs/Labs/Other Studies Reviewed:    The following studies were reviewed today: Outside labs from PCP  EKG:  EKG is not ordered today.    Recent Labs: 06/24/2021: ALT 13; BUN 13; Creatinine, Ser 0.93; Potassium 4.2; Sodium 141   Recent Lipid Panel No results found for: "CHOL", "TRIG", "HDL", "CHOLHDL", "VLDL", "LDLCALC", "LDLDIRECT"  Physical Exam:    VS:  BP 120/74   Pulse 76   Ht '5\' 2"'$  (1.575 m)   Wt 187 lb (84.8 kg)   BMI 34.20 kg/m     Wt Readings from Last 3 Encounters:  01/23/22 187 lb (84.8 kg)  06/24/21 182 lb (82.6 kg)  05/03/20 188 lb 9.6 oz (85.5 kg)    GEN: Well nourished, well developed in no acute distress HEENT: Normal NECK: No JVD; No carotid bruits LYMPHATICS: No lymphadenopathy CARDIAC:RRR, no murmurs, rubs, gallops RESPIRATORY:  Clear to auscultation without rales, wheezing or rhonchi  ABDOMEN: Soft, non-tender, non-distended MUSCULOSKELETAL:  No edema; No deformity  SKIN: Warm and dry NEUROLOGIC:  Alert and oriented x 3 PSYCHIATRIC:  Normal affect     ASSESSMENT:    1. Chronic systolic congestive heart failure, NYHA class 2 ([place])   2. Essential hypertension, benign   3. Nonischemic cardiomyopathy ([place])   4. Hypercholesteremia   5. Nonrheumatic mitral valve regurgitation    PLAN:    In order of problems listed above:  1.  Chronic systolic CHF - NYHA class 1 -last echo 06/2021 with EF 35-40% with global HK -she appears euvolemic on exam today -she has not had any significant SOB or LE edema and weight remains  stable -continue prescription drug management with Carvedilol '25mg'$  BID, Lasix '20mg'$  daily, spiro '25mg'$  daily with PRN refills   -Add Jardiance '10mg'$  daily -increase Entresto to 97-'103mg'$  BID -check BMET in 1 week -repeat cMRI in 2 months on max GDMT to reassess EF and MR  2.  HTN -BP controlled on exam today -continue prescription drug management with Carvedilol '25mg'$  BID, Arlyce Harman '25mg'$  daily with PRN refills -increase Entresto to 97-'103mg'$  BID -check BMET in 1 week  3.  NICM -2D echocardiogram 04/26/2017 showed moderately reduced LV function with EF 35-40% with septal apical  and distal anterior wall hypokinesis -cardiac MRI done 05/21/2017 showed mildly dilated LV with marked paradoxical septal motion secondary to left bundle branch block and EF 52%.  There was no late gadolinium enhancement.  -as above will continue Carvedilol, Mearl Latin and add SGLT2i and repeat cMRI in 2 months to reassess MR and EF  4.  HLD -followed by PCP -continue Crestor '10mg'$  daily  5.  Moderate MR -likely related to dilated LV and LV dysfunction -repeat cMRI in 2 months after titrating to max GDMT  Medication Adjustments/Labs and Tests Ordered: Current medicines are reviewed at length with the patient today.  Concerns regarding medicines are outlined above.  No orders of the defined types were placed in this encounter.  No orders of the defined types were placed in this encounter.   Signed, Fransico Him, MD  01/23/2022 8:26 AM    [place]

## 2022-01-23 ENCOUNTER — Encounter: Payer: Self-pay | Admitting: Cardiology

## 2022-01-23 ENCOUNTER — Ambulatory Visit: Payer: 59 | Attending: Cardiology | Admitting: Cardiology

## 2022-01-23 VITALS — BP 120/74 | HR 76 | Ht 62.0 in | Wt 187.0 lb

## 2022-01-23 DIAGNOSIS — I10 Essential (primary) hypertension: Secondary | ICD-10-CM

## 2022-01-23 DIAGNOSIS — I428 Other cardiomyopathies: Secondary | ICD-10-CM | POA: Diagnosis not present

## 2022-01-23 DIAGNOSIS — E7800 Pure hypercholesterolemia, unspecified: Secondary | ICD-10-CM

## 2022-01-23 DIAGNOSIS — I5022 Chronic systolic (congestive) heart failure: Secondary | ICD-10-CM

## 2022-01-23 DIAGNOSIS — I340 Nonrheumatic mitral (valve) insufficiency: Secondary | ICD-10-CM

## 2022-01-23 MED ORDER — SACUBITRIL-VALSARTAN 97-103 MG PO TABS: 1.0000 | ORAL_TABLET | Freq: Two times a day (BID) | ORAL | 3 refills | Status: DC

## 2022-01-23 MED ORDER — EMPAGLIFLOZIN 10 MG PO TABS: 10.0000 mg | ORAL_TABLET | Freq: Every day | ORAL | 3 refills | Status: DC

## 2022-01-23 NOTE — Patient Instructions (Signed)
Medication Instructions:  Your physician has recommended you make the following change in your medication:  Increase Entresto to 97-'103mg'$  2 times daily Start Jardiance '10mg'$  daily  *If you need a refill on your cardiac medications before your next appointment, please call your pharmacy*   Lab Work: BMET 1 week If you have labs (blood work) drawn today and your tests are completely normal, you will receive your results only by: [place] (if you have MyChart) OR A paper copy in the mail If you have any lab test that is abnormal or we need to change your treatment, we will call you to review the results.   Testing/Procedures: .Your physician has requested that you have a cardiac MRI. Cardiac MRI uses a computer to create images of your heart as its beating, producing both still and moving pictures of your heart and major blood vessels. For further information please visit [email or web address] Please follow the instruction sheet given to you today for more information.    Follow-Up: At [facility], you and your health needs are our priority.  As part of our continuing mission to provide you with exceptional heart care, we have created designated Provider Care Teams.  These Care Teams include your primary Cardiologist (physician) and Advanced Practice Providers (APPs -  Physician Assistants and Nurse Practitioners) who all work together to provide you with the care you need, when you need it.  We recommend signing up for the patient portal called "MyChart".  Sign up information is provided on this After Visit Summary.  MyChart is used to connect with patients for Virtual Visits (Telemedicine).  Patients are able to view lab/test results, encounter notes, upcoming appointments, etc.  Non-urgent messages can be sent to your provider as well.   To learn more about what you can do with MyChart, go to [email or web address].    Your next appointment:   6 month(s)  The format for  your next appointment:   In Person  Provider:   Fransico Him, MD     Other Instructions   You are scheduled for Cardiac MRI on ______________. Please arrive for your appointment at ______________ ( arrive 30-45 minutes prior to test start time). ?  [facility] [address] [place], [place] [address] [phone] Please take advantage of the free valet parking available at the MAIN entrance (A entrance).  Proceed to the [facility] Radiology Department (First Floor) for check-in.   [facility] [place], [place] [address] [phone] Please take advantage of the free valet parking available at the MAIN entrance. Proceed to [facility] registration for check-in (first floor).  Magnetic resonance imaging (MRI) is a painless test that produces images of the inside of the body without using Xrays.  During an MRI, strong magnets and radio waves work together in a Research officer, political party to form detailed images.   MRI images may provide more details about a medical condition than X-rays, CT scans, and ultrasounds can provide.  You may be given earphones to listen for instructions.  You may eat a light breakfast and take medications as ordered with the exception of HCTZ (fluid pill, other). Please avoid stimulants for 12 hr prior to test. (Ie. Caffeine, nicotine, chocolate, or antihistamine medications)  If a contrast material will be used, an IV will be inserted into one of your veins. Contrast material will be injected into your IV. It will leave your body through your urine within a day. You  may be told to drink plenty of fluids to help flush the contrast material out of your system.  You will be asked to remove all metal, including: Watch, jewelry, and other metal objects including hearing aids, hair pieces and dentures. Also wearable glucose monitoring systems (ie. Freestyle Libre and Omnipods) (Braces and fillings normally are not a  problem.)   TEST WILL TAKE APPROXIMATELY 1 HOUR  PLEASE NOTIFY SCHEDULING AT LEAST 24 HOURS IN ADVANCE IF YOU ARE UNABLE TO KEEP YOUR APPOINTMENT. [phone]  Please call Marchia Bond, cardiac imaging nurse navigator with any questions/concerns. Marchia Bond RN Navigator Cardiac Imaging Gordy Clement RN Navigator Cardiac Imaging [facility] [phone] Office

## 2022-01-23 NOTE — Addendum Note (Signed)
Addended by: Vergia Alcon A on: 01/23/2022 08:42 AM   Modules accepted: Orders

## 2022-01-30 ENCOUNTER — Ambulatory Visit: Payer: 59 | Attending: Cardiology

## 2022-01-30 DIAGNOSIS — I5022 Chronic systolic (congestive) heart failure: Secondary | ICD-10-CM

## 2022-01-30 DIAGNOSIS — I10 Essential (primary) hypertension: Secondary | ICD-10-CM

## 2022-01-30 DIAGNOSIS — E7800 Pure hypercholesterolemia, unspecified: Secondary | ICD-10-CM

## 2022-01-30 DIAGNOSIS — I428 Other cardiomyopathies: Secondary | ICD-10-CM

## 2022-01-30 DIAGNOSIS — I340 Nonrheumatic mitral (valve) insufficiency: Secondary | ICD-10-CM

## 2022-01-31 LAB — BASIC METABOLIC PANEL
BUN/Creatinine Ratio: 12 (ref 12–28)
BUN: 12 mg/dL (ref 8–27)
CO2: 23 mmol/L (ref 20–29)
Calcium: 9.3 mg/dL (ref 8.7–10.3)
Chloride: 107 mmol/L — ABNORMAL HIGH (ref 96–106)
Creatinine, Ser: 0.98 mg/dL (ref 0.57–1.00)
Glucose: 108 mg/dL — ABNORMAL HIGH (ref 70–99)
Potassium: 4.3 mmol/L (ref 3.5–5.2)
Sodium: 141 mmol/L (ref 134–144)
eGFR: 66 mL/min/{1.73_m2} (ref >59)

## 2022-04-14 ENCOUNTER — Telehealth (HOSPITAL_COMMUNITY): Payer: Self-pay | Admitting: *Deleted

## 2022-04-14 NOTE — Telephone Encounter (Signed)
Patient returning call about her upcoming cardiac imaging study; pt verbalizes understanding of appt date/time, parking situation and where to check in, and verified current allergies; name and call back number provided for further questions should they arise  Gordy Clement RN Navigator Cardiac Imaging [facility] [phone] office [phone] cell  She states she's had an cardiac MRI in the past without incident.

## 2022-04-14 NOTE — Telephone Encounter (Signed)
Attempted to call patient regarding upcoming cardiac MRI appointment. Left message on voicemail with name and callback number  Shaquela Weichert RN Navigator Cardiac Imaging [place] [phone] Office [phone] Cell  

## 2022-04-17 ENCOUNTER — Other Ambulatory Visit: Payer: Self-pay | Admitting: Cardiology

## 2022-04-17 ENCOUNTER — Ambulatory Visit (HOSPITAL_COMMUNITY)
Admission: RE | Admit: 2022-04-17 | Discharge: 2022-04-17 | Disposition: A | Discharge status: Home / Self Care | Payer: 59 | Source: Ambulatory Visit | Attending: Cardiology | Admitting: Cardiology

## 2022-04-17 DIAGNOSIS — I10 Essential (primary) hypertension: Secondary | ICD-10-CM | POA: Diagnosis present

## 2022-04-17 DIAGNOSIS — I340 Nonrheumatic mitral (valve) insufficiency: Secondary | ICD-10-CM

## 2022-04-17 DIAGNOSIS — I428 Other cardiomyopathies: Secondary | ICD-10-CM | POA: Insufficient documentation

## 2022-04-17 DIAGNOSIS — E7800 Pure hypercholesterolemia, unspecified: Secondary | ICD-10-CM

## 2022-04-17 DIAGNOSIS — I5022 Chronic systolic (congestive) heart failure: Secondary | ICD-10-CM | POA: Insufficient documentation

## 2022-04-17 MED ORDER — GADOBUTROL 1 MMOL/ML IV SOLN
11.0000 mL | Freq: Once | INTRAVENOUS | Status: AC | PRN
  Administered 2022-04-17: 11 mL via INTRAVENOUS

## 2022-04-19 ENCOUNTER — Telehealth: Payer: Self-pay

## 2022-04-19 NOTE — Telephone Encounter (Signed)
Results reviewed with patient, who verbalizes understanding of improvement in heart failure. She reports that she has stopped taking her jardiance as she felt it was making her feel very weak but endorses taking all her other medications (coreg, lasix, entresto, spironolactone). 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 Medication list updated, forwarded to Dr. Radford Pax.

## 2022-04-19 NOTE — Telephone Encounter (Signed)
-----   Message from Bernestine Amass, RN sent at 04/18/2022  4:36 PM EST -----  ----- Message ----- From: Sueanne Margarita, MD Sent: 04/17/2022   8:36 PM EST To: [facility] Triage  Cardiac MRI showed normal LVF with EF 54% and normal RVF, mildly leaky MV

## 2022-07-13 ENCOUNTER — Encounter: Payer: Self-pay | Admitting: Cardiology

## 2022-07-13 ENCOUNTER — Ambulatory Visit: Payer: 59 | Attending: Cardiology | Admitting: Cardiology

## 2022-07-13 ENCOUNTER — Ambulatory Visit (INDEPENDENT_AMBULATORY_CARE_PROVIDER_SITE_OTHER): Payer: 59

## 2022-07-13 VITALS — BP 132/80 | HR 70 | Ht 62.0 in | Wt 185.8 lb

## 2022-07-13 DIAGNOSIS — I5022 Chronic systolic (congestive) heart failure: Secondary | ICD-10-CM

## 2022-07-13 DIAGNOSIS — R002 Palpitations: Secondary | ICD-10-CM

## 2022-07-13 DIAGNOSIS — E7800 Pure hypercholesterolemia, unspecified: Secondary | ICD-10-CM

## 2022-07-13 DIAGNOSIS — I428 Other cardiomyopathies: Secondary | ICD-10-CM | POA: Diagnosis not present

## 2022-07-13 DIAGNOSIS — I10 Essential (primary) hypertension: Secondary | ICD-10-CM | POA: Diagnosis not present

## 2022-07-13 MED ORDER — CARVEDILOL 25 MG PO TABS: 25.0000 mg | ORAL_TABLET | Freq: Two times a day (BID) | ORAL | 11 refills | Status: DC

## 2022-07-13 MED ORDER — FUROSEMIDE 20 MG PO TABS: 20.0000 mg | ORAL_TABLET | Freq: Every day | ORAL | 11 refills | Status: DC

## 2022-07-13 MED ORDER — SPIRONOLACTONE 25 MG PO TABS: 25.0000 mg | ORAL_TABLET | Freq: Every day | ORAL | 11 refills | Status: DC

## 2022-07-13 NOTE — Patient Instructions (Signed)
Medication Instructions:  Your physician recommends that you continue on your current medications as directed. Please refer to the Current Medication list given to you today.  *If you need a refill on your cardiac medications before your next appointment, please call your pharmacy*   Lab Work: None.  If you have labs (blood work) drawn today and your tests are completely normal, you will receive your results only by: [place] (if you have MyChart) OR A paper copy in the mail If you have any lab test that is abnormal or we need to change your treatment, we will call you to review the results.   Testing/Procedures: [place] [place]- Long Term Monitor Instructions  Your physician has requested you wear a ZIO patch monitor for 14 days.  This is a single patch monitor. [employer] supplies one patch monitor per enrollment. Additional stickers are not available. Please do not apply patch if you will be having a Nuclear Stress Test,  Echocardiogram, Cardiac CT, MRI, or Chest Xray during the period you would be wearing the  monitor. The patch cannot be worn during these tests. You cannot remove and re-apply the  ZIO XT patch monitor.  Your ZIO patch monitor will be mailed 3 day USPS to your address on file. It may take 3-5 days  to receive your monitor after you have been enrolled.  Once you have received your monitor, please review the enclosed instructions. Your monitor  has already been registered assigning a specific monitor serial # to you.  Billing and Patient Assistance Program Information  We have supplied [employer] with any of your insurance information on file for billing purposes. [employer] offers a sliding scale Patient Assistance Program for patients that do not have  insurance, or whose insurance does not completely cover the cost of the ZIO monitor.  You must apply for the Patient Assistance Program to qualify for this discounted rate.  To apply, please call [employer] at [phone],  select option 4, select option 2, ask to apply for  Patient Assistance Program. Theodore Demark will ask your household income, and how many people  are in your household. They will quote your out-of-pocket cost based on that information.  [employer] will also be able to set up a 22-month interest-free payment plan if needed.  Applying the monitor   Shave hair from upper left chest.  Hold abrader disc by orange tab. Rub abrader in 40 strokes over the upper left chest as  indicated in your monitor instructions.  Clean area with 4 enclosed alcohol pads. Let dry.  Apply patch as indicated in monitor instructions. Patch will be placed under collarbone on left  side of chest with arrow pointing upward.  Rub patch adhesive wings for 2 minutes. Remove white label marked "1". Remove the white  label marked "2". Rub patch adhesive wings for 2 additional minutes.  While looking in a mirror, press and release button in center of patch. A small green light will  flash 3-4 times. This will be your only indicator that the monitor has been turned on.  Do not shower for the first 24 hours. You may shower after the first 24 hours.  Press the button if you feel a symptom. You will hear a small click. Record Date, Time and  Symptom in the Patient Logbook.  When you are ready to remove the patch, follow instructions on the last 2 pages of Patient  Logbook. Stick patch monitor onto the last page of Patient Logbook.  Place Patient Logbook  in the blue and white box. Use locking tab on box and tape box closed  securely. The blue and white box has prepaid postage on it. Please place it in the mailbox as  soon as possible. Your physician should have your test results approximately 7 days after the  monitor has been mailed back to [facility].  Call [facility] Customer Care at [phone] if you have questions regarding  your ZIO XT patch monitor. Call them immediately if you see an orange light blinking on your   monitor.  If your monitor falls off in less than 4 days, contact our Monitor department at [phone].  If your monitor becomes loose or falls off after 4 days call [employer] at [phone] for  suggestions on securing your monitor    Follow-Up: At [facility], you and your health needs are our priority.  As part of our continuing mission to provide you with exceptional heart care, we have created designated Provider Care Teams.  These Care Teams include your primary Cardiologist (physician) and Advanced Practice Providers (APPs -  Physician Assistants and Nurse Practitioners) who all work together to provide you with the care you need, when you need it.  We recommend signing up for the patient portal called "MyChart".  Sign up information is provided on this After Visit Summary.  MyChart is used to connect with patients for Virtual Visits (Telemedicine).  Patients are able to view lab/test results, encounter notes, upcoming appointments, etc.  Non-urgent messages can be sent to your provider as well.   To learn more about what you can do with MyChart, go to [email or web address].    Your next appointment:   1 year(s)  Provider:   Armanda Magic, MD

## 2022-07-13 NOTE — Addendum Note (Signed)
Addended by: Luellen Pucker on: 07/13/2022 08:46 AM   Modules accepted: Orders

## 2022-07-13 NOTE — Progress Notes (Signed)
Cardiology Office Note:    Date:  07/13/2022   ID:  Terri [name], DOB 1960-08-30, MRN [id number]  PCP:  Caffie Damme, MD  Cardiologist:  Armanda Magic, MD    Referring MD: Caffie Damme, MD   Chief Complaint  Patient presents with   Cardiomyopathy   Congestive Heart Failure   Hypertension   Hyperlipidemia    History of Present Illness:    Terri [name] is a 62 y.o. female with a hx of  NICM (presumed idiopathic - normal cors 2010, neg cardiac CT 01/2016), chronic systolic CHF, HTN, obesity, hyperlipidemia, LBBB.   2D echo 12/08/15 showed decline in LVEF with septal and apical akinesis, distal anterior wall and inferior wall hypokinesis, EF 30-35% (prev 40-45%), normal diastolic parameters, mild MR, mildly dilated LA.  Coronary CTA 02/03/16 showed calcium score of 0, no significant CAD. Cardiac MRI 05/21/2017 showed mildly dilated LV with marked paradoxical septal motion secondary to left bundle branch block and EF 52%.  There was no late gadolinium enhancement.   Seen by Eligha Bridegroom, NP 06/24/21 and was doing well.  2D echo done 06/2021 showed EF 35-40% with global HK, moderate MR.  She is here today for followup and is doing well.  She denies any exertional chest pain or pressure, SOB, DOE (except for extreme exertion) PND, orthopnea, LE edema, dizziness or syncope. She has had some fluttering in her chest that lasts less than 15 minutes and occurs She is compliant with her meds and is tolerating meds with no SE.     Past Medical History:  Diagnosis Date   Anemia    Chronic systolic congestive heart failure (HCC)    Hypercholesteremia    Hypertension    Insomnia    LBBB (left bundle branch block)    Nonischemic cardiomyopathy (HCC)    a. LVEF 40-45%, presumed idiopathic by echo 05/2012. b. Decline in EF to 30-35% in 2017 - neg cardiac CT for CAD at that time 01/2016. EF 50% by cardiac MRI 04/2017   Obesity    PONV (postoperative nausea and vomiting)     Past Surgical  History:  Procedure Laterality Date   ABDOMINAL HYSTERECTOMY     CARDIAC CATHETERIZATION  2010   normal coronary arteries   COLONOSCOPY     fatty tumor removal Left    Axilla   FEMORAL ARTERY STENT  06/2010   placement of stent in femoral artery for chronic LE edema   IR GENERIC HISTORICAL  02/03/2016   IR US GUIDE VASC ACCESS RIGHT 02/03/2016 Gilmer Mor, DO [facility]   IR GENERIC HISTORICAL  02/03/2016   IR RADIOLOGY PERIPHERAL GUIDED IV START 02/03/2016 Gilmer Mor, DO [facility]   LAPAROSCOPY N/A 07/19/2017   Procedure: DIAGNOSTIC LAPAROSCOPY WITH PARTIAL OMENTECTOMY;  Surgeon: Andria Meuse, MD;  Location: [facility];  Service: General;  Laterality: N/A;   WISDOM TOOTH EXTRACTION      Current Medications: Current Meds  Medication Sig   ezetimibe (ZETIA) 10 MG tablet Take 10 mg by mouth daily.   sacubitril-valsartan (ENTRESTO) 97-103 MG Take 1 tablet by mouth 2 (two) times daily.     Allergies:   Patient has no known allergies.   Social History   Socioeconomic History   Marital status: Widowed    Spouse name: Not on file   Number of children: Not on file   Years of education: Not on file   Highest education level: Not on file  Occupational History   Not on  file  Tobacco Use   Smoking status: Never   Smokeless tobacco: Never  Vaping Use   Vaping Use: Never used  Substance and Sexual Activity   Alcohol use: No   Drug use: No   Sexual activity: Not on file  Other Topics Concern   Not on file  Social History Narrative   Not on file   Social Determinants of Health   Financial Resource Strain: Not on file  Food Insecurity: Not on file  Transportation Needs: Not on file  Physical Activity: Not on file  Stress: Not on file  Social Connections: Not on file     Family History: The patient's family history includes CVA in her mother; Hypertension in her brother, brother, father, mother, and sister.  ROS:   Please see the history of present illness.     ROS  All other systems reviewed and negative.   EKGs/Labs/Other Studies Reviewed:    The following studies were reviewed today: Outside labs from PCP  EKG:  EKG ordered today demonstrates NSR with LBBB  Recent Labs: 01/30/2022: BUN 12; Creatinine, Ser 0.98; Potassium 4.3; Sodium 141   Recent Lipid Panel No results found for: "CHOL", "TRIG", "HDL", "CHOLHDL", "VLDL", "LDLCALC", "LDLDIRECT"  Physical Exam:    VS:  BP 132/80   Pulse 70   Ht 5\' 2"  (1.575 m)   Wt 185 lb 12.8 oz (84.3 kg)   SpO2 98%   BMI 33.98 kg/m     Wt Readings from Last 3 Encounters:  07/13/22 185 lb 12.8 oz (84.3 kg)  01/23/22 187 lb (84.8 kg)  06/24/21 182 lb (82.6 kg)    GEN: Well nourished, well developed in no acute distress HEENT: Normal NECK: No JVD; No carotid bruits LYMPHATICS: No lymphadenopathy CARDIAC:RRR, no murmurs, rubs, gallops RESPIRATORY:  Clear to auscultation without rales, wheezing or rhonchi  ABDOMEN: Soft, non-tender, non-distended MUSCULOSKELETAL:  No edema; No deformity  SKIN: Warm and dry NEUROLOGIC:  Alert and oriented x 3 PSYCHIATRIC:  Normal affect  ASSESSMENT:    1. Chronic systolic congestive heart failure, NYHA class 2 (HCC)   2. Nonischemic cardiomyopathy (HCC)   3. Essential hypertension, benign   4. Hypercholesteremia   5. Palpitations    PLAN:    In order of problems listed above:  1.  Chronic systolic CHF - NYHA class 1/NIDCM -last echo 06/2021 with EF 35-40% with global HK -cMRI 04/18/2022 demonstrated EF 54% with marked septal lateral dyssynchrony due to left bundle branch block, mild MR and no LGE>> it appears that EF is always underestimated on 2D echo because of the marked septal lateral dyssynchrony from her bundle branch block -She does not volume overloaded on exam today and weight is stable -Continue drug management with Entresto 97-103 mg twice daily, carvedilol 25 mg twice daily, spironolactone 25 mg daily with as needed refills -she did not  tolerate Jardiance due to weakness -I have personally reviewed and interpreted outside labs performed by patient's PCP which showed serum creatinine 0.98 and potassium 4.3 on 01/30/2022  2.  HTN -BP is adequate controlled on exam -Continue prescription drug management with Entresto 97-103 mg twice daily, carvedilol 25 mg twice daily, spironolactone 25 mg daily with as needed refills  3.  HLD -followed by PCP -continue Crestor 10mg  daily  4.  Mitral regurgitation -likely related to dilated LV and LV dysfunction -This was mild on recent MRI  5.  Palpitations -last < 15 min at a time and occur at least once weekly -improved  some with cutting out caffeine -will get 2 week ziopatch to rule out PAF  Medication Adjustments/Labs and Tests Ordered: Current medicines are reviewed at length with the patient today.  Concerns regarding medicines are outlined above.  Orders Placed This Encounter  Procedures   EKG 12-Lead   No orders of the defined types were placed in this encounter.   Signed, Armanda Magic, MD  07/13/2022 8:31 AM    [facility]

## 2022-07-13 NOTE — Progress Notes (Unsigned)
Enrolled for [employer] to mail a ZIO XT long term holter monitor to the patients address on file.  

## 2022-07-18 DIAGNOSIS — R002 Palpitations: Secondary | ICD-10-CM

## 2022-07-27 ENCOUNTER — Telehealth: Payer: Self-pay | Admitting: Cardiology

## 2022-07-27 NOTE — Telephone Encounter (Signed)
Called patient to clarify message. She states she had to send her zio monitor back because it kept falling off and after talking to the [employer] about it, they advised her to send zio back and they would send her a new one. Patient advising that she was not able to get much wear time with previous monitor and her monitoring time will be restarting when the new monitor is delivered.

## 2022-07-27 NOTE — Telephone Encounter (Signed)
Patient called to report she turned in her old Zio machine and they have shipped out her new machine today.

## 2022-08-23 ENCOUNTER — Telehealth: Payer: Self-pay

## 2022-08-23 DIAGNOSIS — R002 Palpitations: Secondary | ICD-10-CM

## 2022-08-23 DIAGNOSIS — Z79899 Other long term (current) drug therapy: Secondary | ICD-10-CM

## 2022-08-23 DIAGNOSIS — I5022 Chronic systolic (congestive) heart failure: Secondary | ICD-10-CM

## 2022-08-23 NOTE — Telephone Encounter (Signed)
-----   Message from Frutoso Schatz, RN sent at 08/23/2022 10:27 AM EDT -----  ----- Message ----- From: Quintella Reichert, MD Sent: 08/23/2022  10:09 AM EDT To: [facility] Triage  Heart monitor showed an occasional extra heartbeat from the top and bottom of the heart called PACs and PVCs.  These are benign and were very rare in occurrence.  Would not recommend any change in therapy at this time.  She needs to try to avoid caffeine completely.  Please have her come in for a be met and magnesium level as well as TSH

## 2022-08-23 NOTE — Telephone Encounter (Signed)
Reviewed heart monitor results with patient and provided education on extra beats. Advised that these are likely benign. Provided education on weaning off caffeine and scheduled labs.

## 2022-08-25 ENCOUNTER — Ambulatory Visit: Payer: 59 | Attending: Cardiology

## 2022-08-25 ENCOUNTER — Encounter: Payer: Self-pay | Admitting: Cardiology

## 2022-08-25 DIAGNOSIS — R002 Palpitations: Secondary | ICD-10-CM

## 2022-08-25 DIAGNOSIS — I5022 Chronic systolic (congestive) heart failure: Secondary | ICD-10-CM

## 2022-08-25 DIAGNOSIS — Z79899 Other long term (current) drug therapy: Secondary | ICD-10-CM

## 2022-08-26 LAB — BASIC METABOLIC PANEL
BUN/Creatinine Ratio: 12 (ref 12–28)
BUN: 11 mg/dL (ref 8–27)
CO2: 22 mmol/L (ref 20–29)
Calcium: 9.4 mg/dL (ref 8.7–10.3)
Chloride: 105 mmol/L (ref 96–106)
Creatinine, Ser: 0.89 mg/dL (ref 0.57–1.00)
Glucose: 85 mg/dL (ref 70–99)
Potassium: 4.4 mmol/L (ref 3.5–5.2)
Sodium: 141 mmol/L (ref 134–144)
eGFR: 73 mL/min/{1.73_m2} (ref >59)

## 2022-08-26 LAB — TSH: TSH: 1.75 u[IU]/mL (ref 0.450–4.500)

## 2022-08-26 LAB — MAGNESIUM: Magnesium: 1.9 mg/dL (ref 1.6–2.3)

## 2022-08-28 ENCOUNTER — Telehealth: Payer: Self-pay | Admitting: Cardiology

## 2022-08-28 NOTE — Telephone Encounter (Signed)
Pt aware of lab results ./cy 

## 2022-08-28 NOTE — Telephone Encounter (Signed)
Patient is returning call for labs. Please advise

## 2022-08-28 NOTE — Telephone Encounter (Signed)
Please let patient know that labs were normal.  Continue current medical therapy.

## 2023-01-03 ENCOUNTER — Encounter: Payer: Self-pay | Admitting: Family Medicine

## 2023-01-03 DIAGNOSIS — Z1231 Encounter for screening mammogram for malignant neoplasm of breast: Secondary | ICD-10-CM

## 2023-01-26 ENCOUNTER — Other Ambulatory Visit: Payer: Self-pay | Admitting: Cardiology

## 2023-04-12 ENCOUNTER — Telehealth: Payer: Self-pay

## 2023-04-12 ENCOUNTER — Other Ambulatory Visit (HOSPITAL_COMMUNITY): Payer: Self-pay

## 2023-04-12 NOTE — Telephone Encounter (Signed)
Pharmacy Patient Advocate Encounter  Received notification from [facility] that Prior Authorization for ENTRESTO has been APPROVED from 04/12/23 to 04/11/24. Ran test claim, Copay is $60. This test claim was processed through [facility]- copay amounts may vary at other pharmacies due to pharmacy/plan contracts, or as the patient moves through the different stages of their insurance plan.

## 2023-04-12 NOTE — Telephone Encounter (Signed)
Pharmacy Patient Advocate Encounter   Received notification from CoverMyMeds that prior authorization for ENTRESTO is required/requested.   Insurance verification completed.   The patient is insured through [facility] .   Per test claim: PA required; PA submitted to above mentioned insurance via CoverMyMeds Key/confirmation #/EOC [id number] Status is pending

## 2023-11-16 ENCOUNTER — Other Ambulatory Visit: Payer: Self-pay | Admitting: Cardiology

## 2023-11-16 ENCOUNTER — Telehealth: Payer: Self-pay | Admitting: Cardiology

## 2023-11-16 MED ORDER — SPIRONOLACTONE 25 MG PO TABS: 25.0000 mg | ORAL_TABLET | Freq: Every day | ORAL | 1 refills | Status: DC

## 2023-11-16 MED ORDER — SACUBITRIL-VALSARTAN 97-103 MG PO TABS: 1.0000 | ORAL_TABLET | Freq: Two times a day (BID) | ORAL | 1 refills | Status: DC

## 2023-11-16 MED ORDER — FUROSEMIDE 20 MG PO TABS: 20.0000 mg | ORAL_TABLET | Freq: Every day | ORAL | 1 refills | Status: DC

## 2023-11-16 MED ORDER — CARVEDILOL 25 MG PO TABS: 25.0000 mg | ORAL_TABLET | Freq: Two times a day (BID) | ORAL | 1 refills | Status: DC

## 2023-11-16 NOTE — Telephone Encounter (Signed)
 Pt's medications were sent to pt's pharmacy as requested. Confirmation received.

## 2023-11-16 NOTE — Telephone Encounter (Signed)
*  STAT* If patient is at the pharmacy, call can be transferred to refill team.   1. Which medications need to be refilled? (please list name of each medication and dose if known)           carvedilol  (COREG ) 25 MG tablet   furosemide  (LASIX ) 20 MG tablet  sacubitril -valsartan  (ENTRESTO ) 97-103 MG  spironolactone  (ALDACTONE ) 25 MG tablet    2. Which pharmacy/location (including street and city if local pharmacy) is medication to be sent to?  [facility] - [place], [place] - [address]      3. Do they need a 30 day or 90 day supply? 90 day   Pt is out of medication

## 2023-12-10 NOTE — Progress Notes (Signed)
 Cardiology Office Note   Date:  12/21/2023  ID:  Terri [name], DOB 02-Oct-1960, MRN [id number] PCP: Claudene Round, MD  [facility] Providers Cardiologist:  Wilbert Bihari, MD   History of Present Illness Terri [name] is a 63 y.o. female with past medical history of nonischemic cardiomyopathy, chronic systolic heart failure, hypertension, hyperlipidemia, obesity, LBBB.  Patient followed by Dr. Bihari.  Presents today for an annual appointment.  Patient has history of nonischemic cardiomyopathy, presumed to be idiopathic.  Had normal Cors in 2010.  Echocardiogram in 11/2015 showed EF decreased to 30-35% (previously had been 40-45%).  Underwent coronary CTA in 01/2016 that showed coronary calcium score of 0, no significant CAD.    Cardiac MRI in 04/2017 showed mildly dilated left ventricle with mild basal septal hypertrophy and mildly decreased systolic function (EF 52%), there was very pronounced paradoxical septal motion that was secondary to LBBB.  Notably gadolinium enhancement in the LV myocardium.  Mild MR. Cardiac monitor in 09/2018 showed normal sinus rhythm with average heart rate 77 bpm, occasional PVCs.  Echocardiogram in 06/2021 showed EF 35-40% with mild LVH, grade 1 DD, normal RV systolic function.  Moderate mitral valve regurgitation.  Cardiac MRI in 03/2022 showed normal LV size with EF 54%, septal-lateral dyssynchrony consistent with LBBB, normal RV size and systolic function with EF 58%, mild MR.  No myocardial LGE.  Cardiac monitor in 07/2022 showed predominantly normal sinus rhythm with average heart rate 70 bpm, rare PVCs and PACs.  Patient was last seen by Dr. Bihari on 07/13/2022.  At that time, patient was doing well.  Denied exertional chest pain or pressure.  No shortness of breath or dyspnea on exertion.  Remained on Entresto  97-23 mg twice daily, carvedilol  25 mg twice daily, spironolactone  25 mg daily.  Did not tolerate Jardiance  due to weakness.  Today, patient  presents for an overdue annual follow-up appointment.  Reports that she has overall been doing well from a cardiac perspective.  She has a couple of episodes of chest pain per month.  Describes a feeling of pain in her chest that sometimes affects her back as well.  Pain is not triggered by exertion, but if she is already having pain it worsens with exertion.  She denies shortness of breath.  Denies lower extremity swelling.  She has had 2 episodes of dizziness in the past year.  Denies syncope or near syncope.  She is trying to lose weight but is having a difficult time.  Notes feeling very fatigued.  She works as a Sales executive, often does not have the energy to exercise when she finishes work.  She follows regularly with her primary care provider.   Studies Reviewed Cardiac Studies & Procedures   ______________________________________________________________________________________________     ECHOCARDIOGRAM  ECHOCARDIOGRAM COMPLETE 07/19/2021  Narrative ECHOCARDIOGRAM REPORT    Patient Name:   Terri [name] Date of Exam: 07/19/2021 Medical Rec #:  [id number]       Height:       62.0 in Accession #:    [id number]      Weight:       182.0 lb Date of Birth:  1961-01-12       BSA:          1.837 m Patient Age:    61 years        BP:           136/70 mmHg Patient Gender: F  HR:           62 bpm. Exam Location:  [address]  Procedure: 2D Echo, Cardiac Doppler and Color Doppler  Indications:    I42.8 NICM  History:        Patient has prior history of Echocardiogram examinations, most recent 11/12/2018. Non ischemic cardiomyopathy, MR, Arrythmias:LBBB; Risk Factors:Hypertension and Dyslipidemia.  Sonographer:    Elsie Bohr RDCS Referring Phys: [id number] ROSALINE HERO SWINYER  IMPRESSIONS   1. Compared to 11/12/18, MR now appears to be moderate. 2. Left ventricular ejection fraction, by estimation, is 35 to 40%. The left ventricle has moderately decreased function.  The left ventricle demonstrates global hypokinesis. There is mild left ventricular hypertrophy of the basal-septal segment. Left ventricular diastolic parameters are consistent with Grade I diastolic dysfunction (impaired relaxation). 3. Right ventricular systolic function is normal. The right ventricular size is normal. 4. The mitral valve is normal in structure. Moderate mitral valve regurgitation. No evidence of mitral stenosis. 5. The aortic valve is tricuspid. Aortic valve regurgitation is not visualized. No aortic stenosis is present. 6. The inferior vena cava is normal in size with greater than 50% respiratory variability, suggesting right atrial pressure of 3 mmHg.  FINDINGS Left Ventricle: Left ventricular ejection fraction, by estimation, is 35 to 40%. The left ventricle has moderately decreased function. The left ventricle demonstrates global hypokinesis. The left ventricular internal cavity size was normal in size. There is mild left ventricular hypertrophy of the basal-septal segment. Abnormal (paradoxical) septal motion, consistent with left bundle branch block. Left ventricular diastolic parameters are consistent with Grade I diastolic dysfunction (impaired relaxation).  Right Ventricle: The right ventricular size is normal. Right ventricular systolic function is normal.  Left Atrium: Left atrial size was normal in size.  Right Atrium: Right atrial size was normal in size.  Pericardium: Trivial pericardial effusion is present.  Mitral Valve: The mitral valve is normal in structure. Moderate mitral valve regurgitation. No evidence of mitral valve stenosis.  Tricuspid Valve: The tricuspid valve is normal in structure. Tricuspid valve regurgitation is trivial. No evidence of tricuspid stenosis.  Aortic Valve: The aortic valve is tricuspid. Aortic valve regurgitation is not visualized. No aortic stenosis is present.  Pulmonic Valve: The pulmonic valve was normal in structure.  Pulmonic valve regurgitation is mild. No evidence of pulmonic stenosis.  Aorta: The aortic root is normal in size and structure.  Venous: The inferior vena cava is normal in size with greater than 50% respiratory variability, suggesting right atrial pressure of 3 mmHg.  IAS/Shunts: No atrial level shunt detected by color flow Doppler.  Additional Comments: Compared to 11/12/18, MR now appears to be moderate.   LEFT VENTRICLE PLAX 2D LVIDd:         4.10 cm   Diastology LVIDs:         3.40 cm   LV e' medial:    5.66 cm/s LV PW:         1.40 cm   LV E/e' medial:  14.3 LV IVS:        1.40 cm   LV e' lateral:   9.90 cm/s LVOT diam:     2.00 cm   LV E/e' lateral: 8.2 LV SV:         59 LV SV Index:   32 LVOT Area:     3.14 cm   RIGHT VENTRICLE            IVC TAPSE (M-mode): 1.9 cm     IVC  diam: 0.70 cm RVSP:           20.6 mmHg  LEFT ATRIUM             Index        RIGHT ATRIUM           Index LA diam:        4.10 cm 2.23 cm/m   RA Pressure: 3.00 mmHg LA Vol (A2C):   64.0 ml 34.85 ml/m  RA Area:     8.39 cm LA Vol (A4C):   60.9 ml 33.16 ml/m  RA Volume:   15.80 ml  8.60 ml/m LA Biplane Vol: 62.4 ml 33.98 ml/m AORTIC VALVE LVOT Vmax:   90.20 cm/s LVOT Vmean:  56.100 cm/s LVOT VTI:    0.189 m  AORTA Ao Root diam: 3.10 cm Ao Asc diam:  3.00 cm  MITRAL VALVE                TRICUSPID VALVE MV Area (PHT): 3.99 cm     TR Peak grad:   17.6 mmHg MV Decel Time: 190 msec     TR Vmax:        210.00 cm/s MV E velocity: 80.70 cm/s   Estimated RAP:  3.00 mmHg MV A velocity: 102.00 cm/s  RVSP:           20.6 mmHg MV E/A ratio:  0.79 SHUNTS Systemic VTI:  0.19 m Systemic Diam: 2.00 cm  Redell Shallow MD Electronically signed by Redell Shallow MD Signature Date/Time: 07/19/2021/11:29:09 AM    Final    MONITORS  LONG TERM MONITOR (3-14 DAYS) 08/22/2022  Narrative   Monitor #1 demonstrated predominant rhythm of normal sinus rhythm with an average heart rate of 70 bpm and  ranged from 53 to 103 bpm with interventricular conduction delay and no arrhythmias.   Monitor #2 demonstrated normal sinus rhythm with average heart rate 70 beats per and ranged from 40 to 104 bpm with interventricular conduction delay and significant wandering baseline artifact as well as rare PACs and PVCs as well as ventricular couplets   Patch Wear Time:  15 days and 18 hours (2024-05-21T07:19:18-0400 to 2024-06-15T02:26:25-0400)  Monitor 1 Patient had a min HR of 53 bpm, max HR of 103 bpm, and avg HR of 70 bpm. Predominant underlying rhythm was Sinus Rhythm. Bundle Branch Block/IVCD was present. QRS morphology changes were present throughout recording. No Isolated SVEs, SVE Couplets, or SVE Triplets were present. No Isolated VEs, VE Couplets, or VE Triplets were present.  Monitor 2 Patient had a min HR of 48 bpm, max HR of 104 bpm, and avg HR of 70 bpm. Predominant underlying rhythm was Sinus Rhythm. Bundle Branch Block/IVCD was present. Isolated SVEs were rare (<1.0%), SVE Couplets were rare (<1.0%), and no SVE Triplets were present. Isolated VEs were rare (<1.0%), VE Couplets were rare (<1.0%), and no VE Triplets were present.   CT SCANS  CT CORONARY MORPH W/CTA COR W/SCORE 02/03/2016  Addendum 02/03/2016  3:51 PM ADDENDUM REPORT: 02/03/2016 15:49  EXAM: OVER-READ INTERPRETATION  CT CHEST  The following report is an over-read performed by radiologist Dr. Toribio Cove [facility] on 02/03/2016. This over-read does not include interpretation of cardiac or coronary anatomy or pathology. The coronary calcium score and cardiac CTA interpretation by the cardiologist is attached.  COMPARISON:  None.  FINDINGS: Within the visualized portions of the thorax there are no suspicious appearing pulmonary nodules or masses, there is no acute consolidative airspace disease, no  pleural effusions, no pneumothorax and no lymphadenopathy. Visualized portions of the upper  abdomen are unremarkable. There are no aggressive appearing lytic or blastic lesions noted in the visualized portions of the skeleton.  IMPRESSION: 1. No significant incidental noncardiac findings are noted.   Electronically Signed By: Toribio Aye M.D. On: 02/03/2016 15:49  Narrative CLINICAL DATA:  63 year old female with cardiomyopathy of unknown etiology and LVEF 30-35% on echocardiogram.  EXAM: Cardiac/Coronary  CT  TECHNIQUE: The patient was scanned on a Philips 256 scanner.  FINDINGS: A 120 kV prospective scan was triggered in the descending thoracic aorta at 111 HU's. Axial non-contrast 3 mm slices were carried out through the heart. The data set was analyzed on a dedicated work station and scored using the Agatson method. Gantry rotation speed was 270 msecs and collimation was .9 mm. No beta blockade and 0.8 mg of sl NTG was given. The 3D data set was reconstructed in 5% intervals of the 67-82 % of the R-R cycle. Diastolic phases were analyzed on a dedicated work station using MPR, MIP and VRT modes. The patient received 80 cc of contrast.  Aorta:  Normal size.  No calcifications.  No dissection.  Aortic Valve:  Trileaflet.  No calcifications.  Coronary Arteries:  Normal coronary origin.  Right dominance.  RCA is a large dominant artery that gives rise to PDA and PLVB. There is no plaque.  Left main is a large artery that gives rise to LAD and LCX arteries. The left main has no plaque.  LAD is a medium size vessel that wraps around the apex and gives rise to two large diagonal branches. After the takeoff of the second diagonal artery the LAD has rather small lumen but there is no plaque.  LCX is a non-dominant artery that gives rise to one large OM1 branch. There is no plaque.  Other findings:  Normal pulmonary vein drainage into the left atrium.  Normal let atrial appendage is very large with no evidence for a thrombus.  Normal size pulmonary  artery.  IMPRESSION: 1. Coronary calcium score of 0. This was 0 percentile for age and sex matched control.  2. Normal coronary origin with right dominance.  3. No evidence for CAD.  The findings are consistent with non-ischemic cardiomyopathy.  Leim Moose  Electronically Signed: By: Leim Moose On: 02/03/2016 13:28   CARDIAC MRI  MR CARDIAC MORPHOLOGY W WO CONTRAST 04/17/2022  Narrative CLINICAL DATA:  Nonischemic cardiomyopathy  EXAM: CARDIAC MRI  TECHNIQUE: The patient was scanned on a 1.5 Tesla GE magnet. A dedicated cardiac coil was used. Functional imaging was done using Fiesta sequences. 2,3, and 4 chamber views were done to assess for RWMA's. Modified Simpson's rule using a short axis stack was used to calculate an ejection fraction on a dedicated work [employer]. The patient received 11 cc of Gadavist . After 10 minutes inversion recovery sequences were used to assess for infiltration and scar tissue.  CONTRAST:  Gadavist  11 cc  FINDINGS: Limited images of the lung fields showed no gross abnormalities.  Trivial pericardial effusion. Normal left ventricular size and wall thickness. There was prominent septal-lateral dyssynchrony consistent with LBBB, EF 54%. Normal right ventricular size and systolic function, EF 58%. Normal left and right atrial sizes. Trileaflet aortic valve with no stenosis or regurgitation. Mild mitral regurgitation with regurgitant fraction 18%.  On delayed enhancement imaging, there was no myocardial late gadolinium enhancement (LGE).  MEASUREMENTS: MEASUREMENTS LVEDV 134 mL  LVEDVi 69 mL/m2  LVSV 73 mL  LVEF 54%  RVEDV 76 mL  RVEDVi 39 mL/m2  RVSV 44 mL  RVEF 58%  Aortic forward volume 60 mL  Aortic regurgitant fraction 3%  T1 1055, ECV 29%  IMPRESSION: 1. Nomal LV size with EF 54%, septal-lateral dyssynchrony consistent with LBBB.  2.  Normal RV size and systolic function, EF  58%.  3.  Mild mitral regurgitation, regurgitant fraction 18%.  4. No myocardial LGE, so no definitive evidence for prior MI, infiltrative disease, or myocarditis.  5.  Normal extracellular volume percentage.  Dalton Mclean   Electronically Signed By: Ezra Shuck M.D. On: 04/17/2022 17:56   ______________________________________________________________________________________________      Risk Assessment/Calculations       STOP-Bang Score:  6      Physical Exam VS:  BP (!) 94/56   Pulse 71   Ht 5' 2 (1.575 m)   Wt 200 lb 6.4 oz (90.9 kg)   SpO2 97%   BMI 36.65 kg/m        Wt Readings from Last 3 Encounters:  12/21/23 200 lb 6.4 oz (90.9 kg)  07/13/22 185 lb 12.8 oz (84.3 kg)  01/23/22 187 lb (84.8 kg)    GEN: Well nourished, well developed in no acute distress. Sitting comfortably on the exam table  NECK: No JVD CARDIAC: RRR, no murmurs, rubs, gallops.  RESPIRATORY:  Clear to auscultation without rales, wheezing or rhonchi. Normal WOB on room air   ABDOMEN: Soft, non-tender, non-distended EXTREMITIES:  No edema in BLE; No deformity   ASSESSMENT AND PLAN  Nonischemic cardiomyopathy  LBBB  - Previous coronary CTA in 2017 showed coronary calcium score 0. - Recent echocardiogram from 06/2021 showed EF 35-40% with mild LVH, grade 1 DD, normal RV function, moderate MR. - Cardiac MRI in 04/18/2022 demonstrated EF 54% with marked septal lateral dyssynchrony due to left bundle branch block, mild MR and no LGE>> it appears that EF is always underestimated on 2D echo because of the marked septal lateral dyssynchrony from her bundle branch block  - Patient is euvolemic on exam today.  Denies shortness of breath, lower extremity swelling.  Wears compression socks regularly -Continue Lasix  20 mg daily - Continue carvedilol  25 mg twice daily, Entresto  97-23 mg twice daily, spironolactone  25 mg daily - Previously did not tolerate Jardiance  due to weakness - Ordered BMP for  medication monitoring   Chest pain - Patient previously had coronary CTA in 2017 that showed coronary calcium score 0. - Today, patient tells me that she has a few episodes of chest pain per month.  Pain seems to occur randomly.  It is not necessarily triggered by exertion.  If she is already having chest pain, it does worsen if she tries to exert herself - Ordered nuclear stress test.  Patient will need Lexiscan stress.  HTN  - BP 94/56 in office today.  Patient tells me that this is lower than her usual.  BP often in the 120s/60s at home.  She has had 2 episodes of dizziness in the past year, no symptoms concerning for orthostatic hypotension.  No syncope or near syncope - Continues GDMT for CHF as above.  Ordering BMP as above  Fatigue Sleep disordered breathing - Patient reports feeling very fatigued every day.  Notes that if she is not stimulated at work, she will at times fall asleep.  She does snore.  Has a history of high blood pressure, BMI 36.65 - STOP-BANG 6 - Ordered sleep study  HLD - Labs followed by PCP  - Coronary calcium score 0 in 2017 - Continue zetia 10 mg daily  MR   -likely related to dilated LV and LV dysfunction -This was mild on MRI in 03/2022  - Patient euvolemic on exam today.  Repeat imaging in 3-5 years or as clinically indicated  Informed Consent   Shared Decision Making/Informed Consent{  The risks [chest pain, shortness of breath, cardiac arrhythmias, dizziness, blood pressure fluctuations, myocardial infarction, stroke/transient ischemic attack, nausea, vomiting, allergic reaction, radiation exposure, metallic taste sensation and life-threatening complications (estimated to be 1 in 10,000)], benefits (risk stratification, diagnosing coronary artery disease, treatment guidance) and alternatives of a nuclear stress test were discussed in detail with Ms. Manas and she agrees to proceed.       Dispo: Follow up in 3 months with me   Signed, Rollo FABIENE Louder, PA-C

## 2023-12-21 ENCOUNTER — Ambulatory Visit: Attending: Cardiology | Admitting: Cardiology

## 2023-12-21 ENCOUNTER — Encounter: Payer: Self-pay | Admitting: Cardiology

## 2023-12-21 VITALS — BP 94/56 | HR 71 | Ht 62.0 in | Wt 200.4 lb

## 2023-12-21 DIAGNOSIS — I10 Essential (primary) hypertension: Secondary | ICD-10-CM | POA: Diagnosis not present

## 2023-12-21 DIAGNOSIS — G4730 Sleep apnea, unspecified: Secondary | ICD-10-CM | POA: Diagnosis not present

## 2023-12-21 DIAGNOSIS — R072 Precordial pain: Secondary | ICD-10-CM

## 2023-12-21 DIAGNOSIS — Z79899 Other long term (current) drug therapy: Secondary | ICD-10-CM | POA: Diagnosis not present

## 2023-12-21 DIAGNOSIS — I428 Other cardiomyopathies: Secondary | ICD-10-CM

## 2023-12-21 DIAGNOSIS — E7800 Pure hypercholesterolemia, unspecified: Secondary | ICD-10-CM

## 2023-12-21 DIAGNOSIS — I340 Nonrheumatic mitral (valve) insufficiency: Secondary | ICD-10-CM

## 2023-12-21 LAB — BASIC METABOLIC PANEL WITH GFR
BUN/Creatinine Ratio: 12 (ref 12–28)
BUN: 11 mg/dL (ref 8–27)
CO2: 22 mmol/L (ref 20–29)
Calcium: 9.2 mg/dL (ref 8.7–10.3)
Chloride: 105 mmol/L (ref 96–106)
Creatinine, Ser: 0.94 mg/dL (ref 0.57–1.00)
Glucose: 90 mg/dL (ref 70–99)
Potassium: 4.8 mmol/L (ref 3.5–5.2)
Sodium: 141 mmol/L (ref 134–144)
eGFR: 68 mL/min/1.73 (ref >59)

## 2023-12-21 MED ORDER — EZETIMIBE 10 MG PO TABS: 10.0000 mg | ORAL_TABLET | Freq: Every day | ORAL | 3 refills | Status: AC

## 2023-12-21 MED ORDER — SACUBITRIL-VALSARTAN 97-103 MG PO TABS: 1.0000 | ORAL_TABLET | Freq: Two times a day (BID) | ORAL | 3 refills | Status: DC

## 2023-12-21 MED ORDER — CARVEDILOL 25 MG PO TABS: 25.0000 mg | ORAL_TABLET | Freq: Two times a day (BID) | ORAL | 3 refills | Status: DC

## 2023-12-21 MED ORDER — FUROSEMIDE 20 MG PO TABS: 20.0000 mg | ORAL_TABLET | Freq: Every day | ORAL | 3 refills | Status: DC

## 2023-12-21 MED ORDER — SPIRONOLACTONE 25 MG PO TABS: 25.0000 mg | ORAL_TABLET | Freq: Every day | ORAL | 3 refills | Status: DC

## 2023-12-21 NOTE — Patient Instructions (Addendum)
 Medication Instructions:  Your physician recommends that you continue on your current medications as directed. Please refer to the Current Medication list given to you today.  *If you need a refill on your cardiac medications before your next appointment, please call your pharmacy*  Lab Work: Today- BMP If you have labs (blood work) drawn today and your tests are completely normal, you will receive your results only by: MyChart Message (if you have MyChart) OR A paper copy in the mail If you have any lab test that is abnormal or we need to change your treatment, we will call you to review the results.  Testing/Procedures:  - DO NOT TAKE YOUR ??? MEDICATION THE MORNING OF THE STRESS TEST.  BRING IT WITH YOU TO YOUR APPOINTMENT.  - DO NOT EAT, DRINK, OR USE TOBACCO PRODUCTS WITHIN 4 HOURS OF THE TEST - DRESS PREPARED TO EXERCISE IN A COMFORTABLE, 2 PIECE CLOTHING OUTFIT AND WALKING SHOES - BRING ANY PRESCRIPTION MEDICATIONS WITH YOU  - NOTIFY THE OFFICE 24 HOURS IN ADVANCE IF YOU NEED TO CANCEL - CALL THE OFFICE [phone] IF YOU HAVE ANY QUESTIONS   Follow-Up: At [facility], you and your health needs are our priority.  As part of our continuing mission to provide you with exceptional heart care, our providers are all part of one team.  This team includes your primary Cardiologist (physician) and Advanced Practice Providers or APPs (Physician Assistants and Nurse Practitioners) who all work together to provide you with the care you need, when you need it.  Your next appointment:   3 month(s)  Provider:   Rollo Louder, PA-C          We recommend signing up for the patient portal called MyChart.  Sign up information is provided on this After Visit Summary.  MyChart is used to connect with patients for Virtual Visits (Telemedicine).  Patients are able to view lab/test results, encounter notes, upcoming appointments, etc.  Non-urgent messages can be sent to your provider as  well.   To learn more about what you can do with MyChart, go to [email or web address].   Other Instructions Sleep study ordered.

## 2023-12-24 ENCOUNTER — Ambulatory Visit: Payer: Self-pay | Admitting: Cardiology

## 2023-12-24 DIAGNOSIS — I447 Left bundle-branch block, unspecified: Secondary | ICD-10-CM

## 2023-12-27 ENCOUNTER — Encounter (HOSPITAL_COMMUNITY): Payer: Self-pay | Admitting: *Deleted

## 2024-01-01 ENCOUNTER — Other Ambulatory Visit: Payer: Self-pay | Admitting: Cardiology

## 2024-01-01 DIAGNOSIS — R072 Precordial pain: Secondary | ICD-10-CM

## 2024-01-04 ENCOUNTER — Ambulatory Visit (HOSPITAL_COMMUNITY)
Admission: RE | Admit: 2024-01-04 | Discharge: 2024-01-04 | Disposition: A | Discharge status: Home / Self Care | Source: Ambulatory Visit | Attending: Internal Medicine | Admitting: Internal Medicine

## 2024-01-04 DIAGNOSIS — R072 Precordial pain: Secondary | ICD-10-CM | POA: Insufficient documentation

## 2024-01-04 MED ORDER — REGADENOSON 0.4 MG/5ML IV SOLN
INTRAVENOUS | Status: AC
  Filled 2024-01-04: qty 5

## 2024-01-04 MED ORDER — TECHNETIUM TC 99M TETROFOSMIN IV KIT
10.1000 | PACK | Freq: Once | INTRAVENOUS | Status: AC | PRN
  Administered 2024-01-04: 10.1 via INTRAVENOUS

## 2024-01-04 MED ORDER — REGADENOSON 0.4 MG/5ML IV SOLN
0.4000 mg | Freq: Once | INTRAVENOUS | Status: AC
  Administered 2024-01-04: 0.4 mg via INTRAVENOUS

## 2024-01-04 MED ORDER — TECHNETIUM TC 99M TETROFOSMIN IV KIT
31.3000 | PACK | Freq: Once | INTRAVENOUS | Status: AC | PRN
  Administered 2024-01-04: 31.3 via INTRAVENOUS

## 2024-01-05 LAB — MYOCARDIAL PERFUSION IMAGING
LV dias vol: 88 mL (ref 46–106)
LV sys vol: 32 mL (ref 3.8–5.2)
Nuc Stress EF: 59 %
Peak HR: 91 {beats}/min
Rest HR: 62 {beats}/min
Rest Nuclear Isotope Dose: 10.1 mCi
SDS: 2
SRS: 5
SSS: 7
ST Depression (mm): 0 mm
Stress Nuclear Isotope Dose: 31.3 mCi
TID: 0.89

## 2024-01-07 ENCOUNTER — Other Ambulatory Visit (HOSPITAL_BASED_OUTPATIENT_CLINIC_OR_DEPARTMENT_OTHER): Payer: Self-pay

## 2024-01-07 MED ORDER — IVABRADINE HCL 5 MG PO TABS
ORAL_TABLET | ORAL | 0 refills | Status: DC
  Filled 2024-01-07: qty 3, 1d supply, fill #0

## 2024-01-07 NOTE — Telephone Encounter (Signed)
 Spoke with pt regarding test results. Pt verbalized understanding and agreed to plan. Medication for CTA was sent to [facility] in [facility] per pt request and CTA ordered. Pt was advise she would receive a call to schedule CTA, but if she had any questions to give us  a call at the office.

## 2024-01-08 ENCOUNTER — Other Ambulatory Visit (HOSPITAL_BASED_OUTPATIENT_CLINIC_OR_DEPARTMENT_OTHER): Payer: Self-pay

## 2024-01-14 ENCOUNTER — Encounter (HOSPITAL_BASED_OUTPATIENT_CLINIC_OR_DEPARTMENT_OTHER): Admitting: Cardiology

## 2024-01-14 DIAGNOSIS — R0683 Snoring: Secondary | ICD-10-CM

## 2024-01-17 ENCOUNTER — Ambulatory Visit: Attending: Cardiology

## 2024-01-17 DIAGNOSIS — G4730 Sleep apnea, unspecified: Secondary | ICD-10-CM

## 2024-01-17 NOTE — Procedures (Signed)
   SLEEP STUDY REPORT Patient Information Study Date: 01/14/2024 Patient Name: Terri [name] Patient ID: [id number] Birth Date: 03-Jun-1960 Age: 63 Gender: Female BMI: 36.9 (W=200 lb, H=5' 2'') Referring Physician: Rollo Louder, PA  TEST DESCRIPTION: Home sleep apnea testing was completed using the WatchPat, a Type 1 device, utilizing peripheral arterial tonometry (PAT), chest movement, actigraphy, pulse oximetry, pulse rate, body position and snore. AHI was calculated with apnea and hypopnea using valid sleep time as the denominator. RDI includes apneas, hypopneas, and RERAs. The data acquired and the scoring of sleep and all associated events were performed in accordance with the recommended standards and specifications as outlined in the AASM Manual for the Scoring of Sleep and Associated Events 2.2.0 (2015).  FINDINGS: 1. No evidence of Obstructive Sleep Apnea with AHI 3.6/hr. 2. No Central Sleep Apnea. 3. Oxygen desaturations as low as 86%. 4. Moderate to severe snoring was present. O2 sats were < 88% for 0.2 minutes. 5. Total sleep time was 6 hrs and 21.6 min. 6. 31.6% of total sleep time was spent in REM sleep. 7. Normal sleep onset latency at 21 min. 8. Shortened REM sleep onset latency at 32 min. 9. Total awakenings were 7.  DIAGNOSIS: Normal study with no significant sleep disordered breathing.  RECOMMENDATIONS: 1. Normal study with no significant sleep disordered breathing. 2. Healthy sleep recommendations include: adequate nightly sleep (normal 7-9 hrs/night), avoidance of caffeine after noon and alcohol near bedtime, and maintaining a sleep environment that is cool, dark and quiet. 3. Weight loss for overweight patients is recommended. 4. Snoring recommendations include: weight loss where appropriate, side sleeping, and avoidance of alcohol before bed. 5. Operation of motor vehicle or dangerous equipment must be avoided when feeling drowsy, excessively sleepy,  or mentally fatigued. 6. An ENT consultation which may be useful for specific causes of and possible treatment of bothersome snoring . 7. Weight loss may be of benefit in reducing the severity of snoring.   Signature: Wilbert Bihari, MD; [facility]; Diplomat, American Board of Sleep Medicine Electronically Signed: 01/17/2024 5:40:00 AM

## 2024-01-18 ENCOUNTER — Telehealth: Payer: Self-pay | Admitting: *Deleted

## 2024-01-18 NOTE — Telephone Encounter (Signed)
-----   Message from Wilbert Bihari sent at 01/17/2024  5:41 AM EST ----- Please let patient know that sleep study showed no significant sleep apnea.

## 2024-01-18 NOTE — Telephone Encounter (Signed)
 The patient has been notified of the result via her mychart.

## 2024-01-28 ENCOUNTER — Telehealth (HOSPITAL_COMMUNITY): Payer: Self-pay | Admitting: *Deleted

## 2024-01-28 NOTE — Telephone Encounter (Signed)
 Attempted to call patient regarding upcoming cardiac CT appointment. Left message on voicemail with name and callback number  Larey Brick RN Navigator Cardiac Imaging [facility] [phone] Office [phone] Cell

## 2024-01-28 NOTE — Telephone Encounter (Signed)
 Patient returning call about her upcoming cardiac imaging study; pt verbalizes understanding of appt date/time, parking situation and where to check in, pre-test NPO status and medications ordered, and verified current allergies; name and call back number provided for further questions should they arise  Terri Requena RN Navigator Cardiac Imaging [name] [phone] office [phone] cell

## 2024-01-29 ENCOUNTER — Ambulatory Visit (HOSPITAL_COMMUNITY)
Admission: RE | Admit: 2024-01-29 | Discharge: 2024-01-29 | Disposition: A | Discharge status: Home / Self Care | Source: Ambulatory Visit | Attending: Cardiology | Admitting: Cardiology

## 2024-01-29 DIAGNOSIS — I447 Left bundle-branch block, unspecified: Secondary | ICD-10-CM | POA: Diagnosis present

## 2024-01-29 MED ORDER — NITROGLYCERIN 0.4 MG SL SUBL
0.8000 mg | SUBLINGUAL_TABLET | Freq: Once | SUBLINGUAL | Status: AC
  Administered 2024-01-29: 0.8 mg via SUBLINGUAL

## 2024-01-29 MED ORDER — IOHEXOL 350 MG/ML SOLN
100.0000 mL | Freq: Once | INTRAVENOUS | Status: AC | PRN
  Administered 2024-01-29: 100 mL via INTRAVENOUS

## 2024-01-31 ENCOUNTER — Ambulatory Visit: Payer: Self-pay | Admitting: Cardiology

## 2024-02-25 NOTE — Progress Notes (Signed)
 " Cardiology Office Note   Date:  03/06/2024  ID:  Terri [name], DOB 1960-07-11, MRN [id number] PCP: Claudene Round, MD  [facility] Providers Cardiologist:  Wilbert Bihari, MD Cardiology APP:  Vicci Rollo SAUNDERS, PA-C   History of Present Illness Terri [name] is a 63 y.o. female with past medical history of nonischemic cardiomyopathy, chronic systolic heart failure, hypertension, hyperlipidemia, obesity, LBBB.  Patient followed by Dr. Bihari.  Presents today for a folow up appointment   Patient has history of nonischemic cardiomyopathy, presumed to be idiopathic.  Had normal Cors in 2010.  Echocardiogram in 11/2015 showed EF decreased to 30-35% (previously had been 40-45%).  Underwent coronary CTA in 01/2016 that showed coronary calcium score of 0, no significant CAD.     Cardiac MRI in 04/2017 showed mildly dilated left ventricle with mild basal septal hypertrophy and mildly decreased systolic function (EF 52%), there was very pronounced paradoxical septal motion that was secondary to LBBB.  Notably gadolinium enhancement in the LV myocardium.  Mild MR. Cardiac monitor in 09/2018 showed normal sinus rhythm with average heart rate 77 bpm, occasional PVCs.   Echocardiogram in 06/2021 showed EF 35-40% with mild LVH, grade 1 DD, normal RV systolic function.  Moderate mitral valve regurgitation.  Cardiac MRI in 03/2022 showed normal LV size with EF 54%, septal-lateral dyssynchrony consistent with LBBB, normal RV size and systolic function with EF 58%, mild MR.  No myocardial LGE.  Cardiac monitor in 07/2022 showed predominantly normal sinus rhythm with average heart rate 70 bpm, rare PVCs and PACs.   Patient was last seen by Dr. Bihari on 07/13/2022.  At that time, patient was doing well.  Denied exertional chest pain or pressure.  No shortness of breath or dyspnea on exertion.  Remained on Entresto  97-23 mg twice daily, carvedilol  25 mg twice daily, spironolactone  25 mg daily.  Did not tolerate  Jardiance  due to weakness.  I saw patient on 12/21/23 for an overdue follow up appointment. Reported episodes of chest pain. Underwent stress test that showed findings consistent with septal infarction vs perfusion defect secondary to LBBB. Underwent coronary CTA on 01/29/24 that showed nonobstructive CAD with minimal stenosis in the LM and proximal Lcx.  Today, patient tells me that she has been overall doing well since last being seen.  We reviewed the results of her stress test, her coronary CTA, and her sleep study.  She denies having chest pain.  Had a cold a few weeks ago and had some shortness of breath and wheezing with that.  Over time, this has been improving and her breathing is now back to normal.  She denies palpitations.  Denies syncope or near syncope.  Denies lower extremity swelling.  She wears compression socks every day.  Has been compliant with her medication and denies any questions or concerns. 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    Studies Reviewed     Cardiac Studies & Procedures   ______________________________________________________________________________________________   STRESS TESTS  MYOCARDIAL PERFUSION IMAGING 01/04/2024  Interpretation Summary   Findings are consistent with possible septal infarction vs perfusion defect secondary to LBBB. The study is intermediate risk.   No ST deviation was noted.   LV perfusion is abnormal. There is no evidence of ischemia. There is evidence of infarction. Defect 1: There is a medium defect with severe reduction in uptake present in the apical to basal septal location(s) that is fixed. There is abnormal wall motion in the defect area. Consistent with  infarction.   Left ventricular function is normal. Nuclear stress EF: 59%. The left ventricular ejection fraction is normal (55-65%). End diastolic cavity size is normal. End systolic cavity size is normal. No evidence of transient ischemic dilation (TID) noted.   CT images  were obtained for attenuation correction and were examined for the presence of coronary calcium when appropriate.   Coronary calcium was absent on the attenuation correction CT images.   Prior study available for comparison from 06/10/2013. There are changes compared to prior study which appear to be new.   ECHOCARDIOGRAM  ECHOCARDIOGRAM COMPLETE 07/19/2021  Narrative ECHOCARDIOGRAM REPORT    Patient Name:   Terri [name] Date of Exam: 07/19/2021 Medical Rec #:  [id number]       Height:       62.0 in Accession #:    [id number]      Weight:       182.0 lb Date of Birth:  Sep 08, 1960       BSA:          1.837 m Patient Age:    61 years        BP:           136/70 mmHg Patient Gender: F               HR:           62 bpm. Exam Location:  [address]  Procedure: 2D Echo, Cardiac Doppler and Color Doppler  Indications:    I42.8 NICM  History:        Patient has prior history of Echocardiogram examinations, most recent 11/12/2018. Non ischemic cardiomyopathy, MR, Arrythmias:LBBB; Risk Factors:Hypertension and Dyslipidemia.  Sonographer:    Elsie Bohr RDCS Referring Phys: [id number] ROSALINE HERO SWINYER  IMPRESSIONS   1. Compared to 11/12/18, MR now appears to be moderate. 2. Left ventricular ejection fraction, by estimation, is 35 to 40%. The left ventricle has moderately decreased function. The left ventricle demonstrates global hypokinesis. There is mild left ventricular hypertrophy of the basal-septal segment. Left ventricular diastolic parameters are consistent with Grade I diastolic dysfunction (impaired relaxation). 3. Right ventricular systolic function is normal. The right ventricular size is normal. 4. The mitral valve is normal in structure. Moderate mitral valve regurgitation. No evidence of mitral stenosis. 5. The aortic valve is tricuspid. Aortic valve regurgitation is not visualized. No aortic stenosis is present. 6. The inferior vena cava is normal in size with greater  than 50% respiratory variability, suggesting right atrial pressure of 3 mmHg.  FINDINGS Left Ventricle: Left ventricular ejection fraction, by estimation, is 35 to 40%. The left ventricle has moderately decreased function. The left ventricle demonstrates global hypokinesis. The left ventricular internal cavity size was normal in size. There is mild left ventricular hypertrophy of the basal-septal segment. Abnormal (paradoxical) septal motion, consistent with left bundle branch block. Left ventricular diastolic parameters are consistent with Grade I diastolic dysfunction (impaired relaxation).  Right Ventricle: The right ventricular size is normal. Right ventricular systolic function is normal.  Left Atrium: Left atrial size was normal in size.  Right Atrium: Right atrial size was normal in size.  Pericardium: Trivial pericardial effusion is present.  Mitral Valve: The mitral valve is normal in structure. Moderate mitral valve regurgitation. No evidence of mitral valve stenosis.  Tricuspid Valve: The tricuspid valve is normal in structure. Tricuspid valve regurgitation is trivial. No evidence of tricuspid stenosis.  Aortic Valve: The aortic valve is tricuspid. Aortic valve regurgitation is not visualized. No  aortic stenosis is present.  Pulmonic Valve: The pulmonic valve was normal in structure. Pulmonic valve regurgitation is mild. No evidence of pulmonic stenosis.  Aorta: The aortic root is normal in size and structure.  Venous: The inferior vena cava is normal in size with greater than 50% respiratory variability, suggesting right atrial pressure of 3 mmHg.  IAS/Shunts: No atrial level shunt detected by color flow Doppler.  Additional Comments: Compared to 11/12/18, MR now appears to be moderate.   LEFT VENTRICLE PLAX 2D LVIDd:         4.10 cm   Diastology LVIDs:         3.40 cm   LV e' medial:    5.66 cm/s LV PW:         1.40 cm   LV E/e' medial:  14.3 LV IVS:        1.40 cm    LV e' lateral:   9.90 cm/s LVOT diam:     2.00 cm   LV E/e' lateral: 8.2 LV SV:         59 LV SV Index:   32 LVOT Area:     3.14 cm   RIGHT VENTRICLE            IVC TAPSE (M-mode): 1.9 cm     IVC diam: 0.70 cm RVSP:           20.6 mmHg  LEFT ATRIUM             Index        RIGHT ATRIUM           Index LA diam:        4.10 cm 2.23 cm/m   RA Pressure: 3.00 mmHg LA Vol (A2C):   64.0 ml 34.85 ml/m  RA Area:     8.39 cm LA Vol (A4C):   60.9 ml 33.16 ml/m  RA Volume:   15.80 ml  8.60 ml/m LA Biplane Vol: 62.4 ml 33.98 ml/m AORTIC VALVE LVOT Vmax:   90.20 cm/s LVOT Vmean:  56.100 cm/s LVOT VTI:    0.189 m  AORTA Ao Root diam: 3.10 cm Ao Asc diam:  3.00 cm  MITRAL VALVE                TRICUSPID VALVE MV Area (PHT): 3.99 cm     TR Peak grad:   17.6 mmHg MV Decel Time: 190 msec     TR Vmax:        210.00 cm/s MV E velocity: 80.70 cm/s   Estimated RAP:  3.00 mmHg MV A velocity: 102.00 cm/s  RVSP:           20.6 mmHg MV E/A ratio:  0.79 SHUNTS Systemic VTI:  0.19 m Systemic Diam: 2.00 cm  Redell Shallow MD Electronically signed by Redell Shallow MD Signature Date/Time: 07/19/2021/11:29:09 AM    Final    MONITORS  LONG TERM MONITOR (3-14 DAYS) 08/22/2022  Narrative   Monitor #1 demonstrated predominant rhythm of normal sinus rhythm with an average heart rate of 70 bpm and ranged from 53 to 103 bpm with interventricular conduction delay and no arrhythmias.   Monitor #2 demonstrated normal sinus rhythm with average heart rate 70 beats per and ranged from 40 to 104 bpm with interventricular conduction delay and significant wandering baseline artifact as well as rare PACs and PVCs as well as ventricular couplets   Patch Wear Time:  15 days and 18 hours (2024-05-21T07:19:18-0400 to 2024-06-15T02:26:25-0400)  Monitor 1  Patient had a min HR of 53 bpm, max HR of 103 bpm, and avg HR of 70 bpm. Predominant underlying rhythm was Sinus Rhythm. Bundle Branch Block/IVCD was  present. QRS morphology changes were present throughout recording. No Isolated SVEs, SVE Couplets, or SVE Triplets were present. No Isolated VEs, VE Couplets, or VE Triplets were present.  Monitor 2 Patient had a min HR of 48 bpm, max HR of 104 bpm, and avg HR of 70 bpm. Predominant underlying rhythm was Sinus Rhythm. Bundle Branch Block/IVCD was present. Isolated SVEs were rare (<1.0%), SVE Couplets were rare (<1.0%), and no SVE Triplets were present. Isolated VEs were rare (<1.0%), VE Couplets were rare (<1.0%), and no VE Triplets were present.   CT SCANS  CT CORONARY MORPH W/CTA COR W/SCORE 01/29/2024  Addendum 02/01/2024 12:22 AM ADDENDUM REPORT: 02/01/2024 00:20  EXAM: OVER-READ INTERPRETATION  CT CHEST  The following report is an over-read performed by radiologist Dr. Suzen Dials of [facility] on 02/01/2024. This over-read does not include interpretation of cardiac or coronary anatomy or pathology. The coronary calcium score/coronary CTA interpretation by the cardiologist is attached.  COMPARISON:  February 03, 2016  FINDINGS: Cardiovascular: There are no significant extracardiac vascular findings.  Mediastinum/Nodes: There are no enlarged lymph nodes within the visualized mediastinum.  Lungs/Pleura: There is no pleural effusion. The visualized lungs appear clear.  Upper abdomen: No significant findings in the visualized upper abdomen.  Musculoskeletal/Chest wall: No chest wall mass or suspicious osseous findings within the visualized chest.  IMPRESSION: No significant extracardiac findings within the visualized chest.   Electronically Signed By: Suzen Dials M.D. On: 02/01/2024 00:20  Narrative CLINICAL DATA:  Chest pain  EXAM: Cardiac/Coronary CTA  TECHNIQUE: A non-contrast, gated CT scan was obtained with axial slices of 2.5 mm through the heart for calcium scoring. Calcium scoring was performed using the Agatston method. A 120 kV  prospective, gated, contrast cardiac CT scan was obtained. Gantry rotation speed was 230 msec and collimation was 0.63 mm. Two sublingual nitroglycerin  tablets (0.8 mg) were given. The 3D data set was reconstructed with motion correction for the best systolic or diastolic phase. Images were analyzed on a dedicated workstation using MPR, MIP, and VRT modes. The patient received 95 cc of contrast.  FINDINGS: Image quality: Average  Noise artifact is: Limited.  Coronary Arteries:  Normal coronary origin.  Right dominance.  Left main: The left main is a large caliber vessel with a normal take off from the left coronary cusp that bifurcates to form a left anterior descending artery and a left circumflex artery. Calcified plaque in left main causes 0-24% stenosis  Left anterior descending artery: The LAD is patent without evidence of plaque or stenosis. The LAD gives off 2 patent diagonal branches.  Ramus intermedius: Patent with no evidence of plaque or stenosis.  Left circumflex artery: The LCX is non-dominant and patent. The LCX gives off 1 patent obtuse marginal branch. Noncalcified plaque in proximal LCX causes 0-24% stenosis  Right coronary artery: The RCA is dominant with normal take off from the right coronary cusp. There is no evidence of plaque or stenosis. The RCA terminates as a PDA and right posterolateral branch without evidence of plaque or stenosis.  Right Atrium: Right atrial size is within normal limits.  Right Ventricle: The right ventricular cavity is within normal limits.  Left Atrium: Left atrial size is normal in size with no left atrial appendage filling defect.  Left Ventricle: The ventricular cavity size is  within normal limits.  Pulmonary arteries: Normal in size.  Pulmonary veins: Normal pulmonary venous drainage.  Pericardium: Normal thickness without significant effusion or calcium present.  Cardiac valves: The aortic valve is trileaflet  without significant calcification. The mitral valve is normal without significant calcification.  Aorta: Normal caliber without significant disease.  Extra-cardiac findings: See attached radiology report for non-cardiac structures.  IMPRESSION: 1. Coronary calcium score of 8. This was 71st percentile for age-, sex, and race-matched controls.  2. Normal coronary origin with right dominance.  3. Nonobstructive CAD, with minimal (0-24%) stenosis in left main and proximal LCX  RECOMMENDATIONS: CAD-RADS 1: Minimal non-obstructive CAD (0-24%). Consider non-atherosclerotic causes of chest pain. Consider preventive therapy and risk factor modification.  Electronically Signed: By: Lonni Nanas M.D. On: 01/30/2024 18:51   CT SCANS  CT CORONARY MORPH W/CTA COR W/SCORE 02/03/2016  Addendum 02/03/2016  3:51 PM ADDENDUM REPORT: 02/03/2016 15:49  EXAM: OVER-READ INTERPRETATION  CT CHEST  The following report is an over-read performed by radiologist Dr. Toribio Cove [facility] on 02/03/2016. This over-read does not include interpretation of cardiac or coronary anatomy or pathology. The coronary calcium score and cardiac CTA interpretation by the cardiologist is attached.  COMPARISON:  None.  FINDINGS: Within the visualized portions of the thorax there are no suspicious appearing pulmonary nodules or masses, there is no acute consolidative airspace disease, no pleural effusions, no pneumothorax and no lymphadenopathy. Visualized portions of the upper abdomen are unremarkable. There are no aggressive appearing lytic or blastic lesions noted in the visualized portions of the skeleton.  IMPRESSION: 1. No significant incidental noncardiac findings are noted.   Electronically Signed By: Toribio Aye M.D. On: 02/03/2016 15:49  Narrative CLINICAL DATA:  63 year old female with cardiomyopathy of unknown etiology and LVEF 30-35% on  echocardiogram.  EXAM: Cardiac/Coronary  CT  TECHNIQUE: The patient was scanned on a Philips 256 scanner.  FINDINGS: A 120 kV prospective scan was triggered in the descending thoracic aorta at 111 HU's. Axial non-contrast 3 mm slices were carried out through the heart. The data set was analyzed on a dedicated work station and scored using the Agatson method. Gantry rotation speed was 270 msecs and collimation was .9 mm. No beta blockade and 0.8 mg of sl NTG was given. The 3D data set was reconstructed in 5% intervals of the 67-82 % of the R-R cycle. Diastolic phases were analyzed on a dedicated work station using MPR, MIP and VRT modes. The patient received 80 cc of contrast.  Aorta:  Normal size.  No calcifications.  No dissection.  Aortic Valve:  Trileaflet.  No calcifications.  Coronary Arteries:  Normal coronary origin.  Right dominance.  RCA is a large dominant artery that gives rise to PDA and PLVB. There is no plaque.  Left main is a large artery that gives rise to LAD and LCX arteries. The left main has no plaque.  LAD is a medium size vessel that wraps around the apex and gives rise to two large diagonal branches. After the takeoff of the second diagonal artery the LAD has rather small lumen but there is no plaque.  LCX is a non-dominant artery that gives rise to one large OM1 branch. There is no plaque.  Other findings:  Normal pulmonary vein drainage into the left atrium.  Normal let atrial appendage is very large with no evidence for a thrombus.  Normal size pulmonary artery.  IMPRESSION: 1. Coronary calcium score of 0. This was 0 percentile for  age and sex matched control.  2. Normal coronary origin with right dominance.  3. No evidence for CAD.  The findings are consistent with non-ischemic cardiomyopathy.  Leim Moose  Electronically Signed: By: Leim Moose On: 02/03/2016 13:28   CARDIAC MRI  MR CARDIAC MORPHOLOGY W WO CONTRAST  04/17/2022  Narrative CLINICAL DATA:  Nonischemic cardiomyopathy  EXAM: CARDIAC MRI  TECHNIQUE: The patient was scanned on a 1.5 Tesla GE magnet. A dedicated cardiac coil was used. Functional imaging was done using Fiesta sequences. 2,3, and 4 chamber views were done to assess for RWMA's. Modified Simpson's rule using a short axis stack was used to calculate an ejection fraction on a dedicated work [employer]. The patient received 11 cc of Gadavist . After 10 minutes inversion recovery sequences were used to assess for infiltration and scar tissue.  CONTRAST:  Gadavist  11 cc  FINDINGS: Limited images of the lung fields showed no gross abnormalities.  Trivial pericardial effusion. Normal left ventricular size and wall thickness. There was prominent septal-lateral dyssynchrony consistent with LBBB, EF 54%. Normal right ventricular size and systolic function, EF 58%. Normal left and right atrial sizes. Trileaflet aortic valve with no stenosis or regurgitation. Mild mitral regurgitation with regurgitant fraction 18%.  On delayed enhancement imaging, there was no myocardial late gadolinium enhancement (LGE).  MEASUREMENTS: MEASUREMENTS LVEDV 134 mL  LVEDVi 69 mL/m2  LVSV 73 mL  LVEF 54%  RVEDV 76 mL  RVEDVi 39 mL/m2  RVSV 44 mL  RVEF 58%  Aortic forward volume 60 mL  Aortic regurgitant fraction 3%  T1 1055, ECV 29%  IMPRESSION: 1. Nomal LV size with EF 54%, septal-lateral dyssynchrony consistent with LBBB.  2.  Normal RV size and systolic function, EF 58%.  3.  Mild mitral regurgitation, regurgitant fraction 18%.  4. No myocardial LGE, so no definitive evidence for prior MI, infiltrative disease, or myocarditis.  5.  Normal extracellular volume percentage.  Dalton Mclean   Electronically Signed By: Ezra Shuck M.D. On: 04/17/2022  17:56   ______________________________________________________________________________________________      Risk Assessment/Calculations       STOP-Bang Score:  6      Physical Exam VS:  BP 112/70   Pulse 68   Ht 5' 2 (1.575 m)   Wt 201 lb 3.2 oz (91.3 kg)   SpO2 97%   BMI 36.80 kg/m        Wt Readings from Last 3 Encounters:  03/06/24 201 lb 3.2 oz (91.3 kg)  12/21/23 200 lb 6.4 oz (90.9 kg)  07/13/22 185 lb 12.8 oz (84.3 kg)    GEN: Well nourished, well developed in no acute distress.  Sitting comfortably in chair NECK: No JVD  CARDIAC:  RRR, no murmurs, rubs, gallops.  Radial pulses 2+ bilaterally RESPIRATORY:  Clear to auscultation without rales, wheezing or rhonchi.  Normal work of breathing on room air ABDOMEN: Soft, non-tender, non-distended EXTREMITIES:  No edema in bilateral lower extremities. no deformity   ASSESSMENT AND PLAN  Nonischemic cardiomyopathy  LBBB  - Most recent echocardiogram from 06/2021 showed EF 35-40% with mild LVH, grade 1 DD, normal RV function, moderate MR. - Cardiac MRI in 04/18/2022 demonstrated EF 54% with marked septal lateral dyssynchrony due to left bundle branch block, mild MR and no LGE>> it appears that EF is always underestimated on 2D echo because of the marked septal lateral dyssynchrony from her bundle branch block  - Stress test in 12/2023 showed findings consistent with septal infarction vs  perfusion defect from LBBB. Coronary CTA in 01/2024 showed minimal nonobstructive CAD - Patient is euvolemic on exam today.  Denies shortness of breath, lower extremity swelling.  Wears compression socks regularly - Continue Lasix  20 mg daily - Continue carvedilol  25 mg twice daily, Entresto  97-23 mg twice daily, spironolactone  25 mg daily - Previously did not tolerate Jardiance  due to weakness - K 4.8 and creatinine 0.94 on 12/21/23    Chest pain - Coronary CTA 01/2024 showed a coronary calcium score of 8 (71st percentile), minimal  nonobstructive CAD  - Reviewed reassuring study. Patient denies recurrent chest pain    HTN  -  BP well controlled. No symptoms concerning for orthostatic hypotension.  No syncope or near syncope - Continue GDMT for CHF as above.     Sleep disordered breathing - STOP-BANG 6 - Sleep study from 12/2023 showed no significant sleep apnea    HLD - Labs followed by PCP  - Continue zetia  10 mg daily   MR  -This was mild on MRI in 03/2022  - Patient euvolemic on exam today.  Repeat imaging in 3-5 years or as clinically indicated    Dispo: Follow up in 1 year   Signed, Rollo FABIENE Louder, PA-C   "

## 2024-03-06 ENCOUNTER — Ambulatory Visit: Attending: Cardiology | Admitting: Cardiology

## 2024-03-06 ENCOUNTER — Encounter: Payer: Self-pay | Admitting: Cardiology

## 2024-03-06 VITALS — BP 112/70 | HR 68 | Ht 62.0 in | Wt 201.2 lb

## 2024-03-06 DIAGNOSIS — E7800 Pure hypercholesterolemia, unspecified: Secondary | ICD-10-CM

## 2024-03-06 DIAGNOSIS — G4730 Sleep apnea, unspecified: Secondary | ICD-10-CM

## 2024-03-06 DIAGNOSIS — I10 Essential (primary) hypertension: Secondary | ICD-10-CM | POA: Diagnosis not present

## 2024-03-06 DIAGNOSIS — R072 Precordial pain: Secondary | ICD-10-CM

## 2024-03-06 DIAGNOSIS — I428 Other cardiomyopathies: Secondary | ICD-10-CM | POA: Diagnosis not present

## 2024-03-06 DIAGNOSIS — I447 Left bundle-branch block, unspecified: Secondary | ICD-10-CM | POA: Diagnosis not present

## 2024-03-06 MED ORDER — FUROSEMIDE 20 MG PO TABS: 20.0000 mg | ORAL_TABLET | Freq: Every day | ORAL | 3 refills | Status: AC

## 2024-03-06 MED ORDER — SPIRONOLACTONE 25 MG PO TABS: 25.0000 mg | ORAL_TABLET | Freq: Every day | ORAL | 3 refills | Status: AC

## 2024-03-06 MED ORDER — CARVEDILOL 25 MG PO TABS: 25.0000 mg | ORAL_TABLET | Freq: Two times a day (BID) | ORAL | 3 refills | Status: AC

## 2024-03-06 MED ORDER — SACUBITRIL-VALSARTAN 97-103 MG PO TABS: 1.0000 | ORAL_TABLET | Freq: Two times a day (BID) | ORAL | 3 refills | Status: AC

## 2024-03-06 NOTE — Patient Instructions (Signed)
 Medication Instructions:  No Changes *If you need a refill on your cardiac medications before your next appointment, please call your pharmacy*  Lab Work: None  Follow-Up: At [facility], you and your health needs are our priority.  As part of our continuing mission to provide you with exceptional heart care, our providers are all part of one team.  This team includes your primary Cardiologist (physician) and Advanced Practice Providers or APPs (Physician Assistants and Nurse Practitioners) who all work together to provide you with the care you need, when you need it.  Your next appointment:   1 year(s)  Provider:   Wilbert Bihari, MD
